# Patient Record
Sex: Female | Born: 1985 | Race: White | Hispanic: No | Marital: Single | Smoking: Never smoker
Health system: Southern US, Community
[De-identification: ages and names within clinical notes are randomized; demographics above are authoritative.]

## PROBLEM LIST (undated history)

## (undated) DIAGNOSIS — R87619 Unspecified abnormal cytological findings in specimens from cervix uteri: Secondary | ICD-10-CM

## (undated) DIAGNOSIS — E119 Type 2 diabetes mellitus without complications: Secondary | ICD-10-CM

## (undated) DIAGNOSIS — F329 Major depressive disorder, single episode, unspecified: Secondary | ICD-10-CM

## (undated) DIAGNOSIS — R569 Unspecified convulsions: Secondary | ICD-10-CM

## (undated) DIAGNOSIS — F32A Depression, unspecified: Secondary | ICD-10-CM

## (undated) HISTORY — DX: Major depressive disorder, single episode, unspecified: F32.9

## (undated) HISTORY — PX: NO PAST SURGERIES: SHX2092

## (undated) HISTORY — DX: Unspecified abnormal cytological findings in specimens from cervix uteri: R87.619

## (undated) HISTORY — DX: Unspecified convulsions: R56.9

## (undated) HISTORY — DX: Depression, unspecified: F32.A

---
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
## 2005-07-01 ENCOUNTER — Other Ambulatory Visit: Admission: RE | Admit: 2005-07-01 | Discharge: 2005-07-01 | Payer: Self-pay | Admitting: Obstetrics and Gynecology

## 2006-06-22 ENCOUNTER — Other Ambulatory Visit: Admission: RE | Admit: 2006-06-22 | Discharge: 2006-06-22 | Payer: Self-pay | Admitting: Obstetrics & Gynecology

## 2007-07-06 ENCOUNTER — Other Ambulatory Visit: Admission: RE | Admit: 2007-07-06 | Discharge: 2007-07-06 | Payer: Self-pay | Admitting: Obstetrics and Gynecology

## 2008-07-08 ENCOUNTER — Other Ambulatory Visit: Admission: RE | Admit: 2008-07-08 | Discharge: 2008-07-08 | Payer: Self-pay | Admitting: Obstetrics and Gynecology

## 2008-07-18 DIAGNOSIS — R569 Unspecified convulsions: Secondary | ICD-10-CM

## 2008-07-18 HISTORY — DX: Unspecified convulsions: R56.9

## 2010-06-24 ENCOUNTER — Emergency Department (HOSPITAL_COMMUNITY): Admission: EM | Admit: 2010-06-24 | Discharge: 2010-02-06 | Payer: Self-pay | Admitting: Emergency Medicine

## 2011-03-27 ENCOUNTER — Emergency Department (HOSPITAL_COMMUNITY)
Admission: EM | Admit: 2011-03-27 | Discharge: 2011-03-27 | Discharge status: Against Medical Advice | Payer: Self-pay | Attending: Emergency Medicine | Admitting: Emergency Medicine

## 2011-10-17 ENCOUNTER — Ambulatory Visit (INDEPENDENT_AMBULATORY_CARE_PROVIDER_SITE_OTHER): Payer: BC Managed Care – PPO | Admitting: Family Medicine

## 2011-10-17 ENCOUNTER — Ambulatory Visit: Payer: BC Managed Care – PPO

## 2011-10-17 VITALS — BP 116/80 | HR 106 | Temp 98.6°F | Resp 16 | Ht 61.25 in | Wt 126.6 lb

## 2011-10-17 DIAGNOSIS — M79673 Pain in unspecified foot: Secondary | ICD-10-CM

## 2011-10-17 DIAGNOSIS — M79609 Pain in unspecified limb: Secondary | ICD-10-CM

## 2011-10-17 MED ORDER — HYDROCODONE-ACETAMINOPHEN 5-500 MG PO TABS: 1.0000 | ORAL_TABLET | Freq: Three times a day (TID) | ORAL | Status: AC | PRN

## 2011-10-17 NOTE — Progress Notes (Signed)
  Patient Name: Kelly Gillespie Date of Birth: [birth year] Medical Record Number: [id number] Gender: female Date of Encounter: 10/17/2011  History of Present Illness:  Kelly Gillespie is a 26 y.o. very pleasant female patient who presents with the following:  Larey Seat off a high bar stool last night- caught her left foot in a slat of the chair and twisted it when she fell.  Has used ice and elevation but it is still very sore.  She is able to walk but it hurts.   There is no problem list on file for this patient.  No past medical history on file. No past surgical history on file. History  Substance Use Topics  . Smoking status: Never Smoker   . Smokeless tobacco: Not on file  . Alcohol Use: Not on file   No family history on file. No Known Allergies  Medication list has been reviewed and updated.  Review of Systems: As per HPI- otherwise negative. Denies any chance of pregnancy- on OCP  Physical Examination: Filed Vitals:   10/17/11 1540  BP: 116/80  Pulse: 106  Temp: 98.6 F (37 C)  TempSrc: Oral  Resp: 16  Height: 5' 1.25" (1.556 m)  Weight: 126 lb 9.6 oz (57.425 kg)    Body mass index is 23.73 kg/(m^2).  GEN: WDWN, NAD, Non-toxic, Alert & Oriented x 3 HEENT: Atraumatic, Normocephalic.  Normal respiratory effort.   Ears and Nose: No external deformity. EXTR: No clubbing/cyanosis/edema NEURO: antalgic gait.  PSYCH: Normally interactive. Conversant. Not depressed or anxious appearing.  Calm demeanor.  Left foot is bruised and swollen especially laterally. Tender across dorsum of foot.  Achilles intact.  Tib/ fib and ankle seem to be ok. DP pulse palpable.  Bruising and swelling are very suggestive of fracture.  Normal motion and sensation of toes.   [facility] reading (PRIMARY) by  Dr. Patsy Lager.  Suspect fifth proximal metatarsal fracture  LEFT FOOT - COMPLETE 3+ VIEW  Comparison: None.  Findings: There is no definite fracture. One could question a tiny linear lucency  at the base of the fifth metatarsal. I am not certain that this is an actual injury but to that possibility does exist.  IMPRESSION: No definite fracture. Question tiny linear lucency at the base of the fifth metatarsal that could represent a minimal nondisplaced fracture.  Assessment and Plan: 1. Pain in foot  DG Foot Complete Left, DG Ankle Complete Left, HYDROcodone-acetaminophen (VICODIN) 5-500 MG per tablet   Foot pain and swelling/ bruising which is suspicious for fracture- questionable fracture on xray.  Treat as fractured and protect with short CAM, crutches.  Continue to ice and elevate.  Called patient with xray overread results- plan repeat film in one week, sooner if worse.  May use vicodin as above as needed for more severe pain.

## 2011-10-17 NOTE — Progress Notes (Signed)
Addended by: Abbe Amsterdam C on: 10/17/2011 05:27 PM   Modules accepted: Level of Service

## 2011-10-26 ENCOUNTER — Ambulatory Visit (INDEPENDENT_AMBULATORY_CARE_PROVIDER_SITE_OTHER): Payer: BC Managed Care – PPO | Admitting: Internal Medicine

## 2011-10-26 ENCOUNTER — Ambulatory Visit: Payer: BC Managed Care – PPO

## 2011-10-26 VITALS — BP 118/75 | HR 94 | Resp 16 | Ht 60.0 in | Wt 126.0 lb

## 2011-10-26 DIAGNOSIS — M79609 Pain in unspecified limb: Secondary | ICD-10-CM

## 2011-10-26 DIAGNOSIS — S99922A Unspecified injury of left foot, initial encounter: Secondary | ICD-10-CM | POA: Insufficient documentation

## 2011-10-26 DIAGNOSIS — S92309A Fracture of unspecified metatarsal bone(s), unspecified foot, initial encounter for closed fracture: Secondary | ICD-10-CM

## 2011-10-26 NOTE — Progress Notes (Signed)
  Subjective:    Patient ID: Kelly Gillespie, female    DOB: [birth year], 26 y.o.   MRN: [id number]  HPI Here for recheck Fall 1 week ago  Dx with possible fx of metatarsal 5  Pain left foot continues 5/10 Swelling is diminished Wearing cam walker short boot but non weight bearing on crutches Taking Vicodin sparingly and ibuprofen otc    Review of Systems  Constitutional: Negative.   HENT: Negative.   Eyes: Negative.   Respiratory: Negative.   Cardiovascular: Negative.   Gastrointestinal: Negative.   Genitourinary: Negative.   Musculoskeletal: Positive for joint swelling and gait problem.  Skin: Negative.   Neurological: Negative.   Hematological: Negative.   Psychiatric/Behavioral: Negative.   All other systems reviewed and are negative.       Objective:   Physical Exam  Nursing note and vitals reviewed. Constitutional: She appears well-developed and well-nourished.  HENT:  Head: Normocephalic and atraumatic.  Eyes: Conjunctivae and EOM are normal.  Neck: Normal range of motion. Neck supple.  Cardiovascular: Normal rate, regular rhythm and normal heart sounds.   Pulmonary/Chest: Effort normal and breath sounds normal.  Abdominal: Soft.  Musculoskeletal: She exhibits tenderness.       Tender swelling pain over 5th metatarsal o f hte right foot  Skin: Skin is warm and dry.  Psychiatric: She has a normal mood and affect. Her behavior is normal. Judgment and thought content normal.     [facility] reading (PRIMARY) by  Dr. Mindi Junker small avulsion fx of 5th mt.     Assessment & Plan:  Xray right foot as above. Will allow pt to bear weight in am walker for 2 weeks then re eval

## 2011-10-26 NOTE — Patient Instructions (Signed)
You may bear weight wearing the cam walker for the next 2 weeks then return for re eval.

## 2012-05-04 ENCOUNTER — Ambulatory Visit (INDEPENDENT_AMBULATORY_CARE_PROVIDER_SITE_OTHER): Payer: BC Managed Care – PPO | Admitting: Physician Assistant

## 2012-05-04 VITALS — BP 102/64 | HR 115 | Temp 98.5°F | Resp 16 | Ht 61.25 in | Wt 120.0 lb

## 2012-05-04 DIAGNOSIS — R300 Dysuria: Secondary | ICD-10-CM

## 2012-05-04 DIAGNOSIS — N390 Urinary tract infection, site not specified: Secondary | ICD-10-CM

## 2012-05-04 LAB — POCT UA - MICROSCOPIC ONLY
Casts, Ur, LPF, POC: NEGATIVE
Crystals, Ur, HPF, POC: NEGATIVE
Mucus, UA: NEGATIVE
Yeast, UA: NEGATIVE

## 2012-05-04 LAB — POCT URINALYSIS DIPSTICK
Bilirubin, UA: NEGATIVE
Glucose, UA: NEGATIVE
Ketones, UA: NEGATIVE
Nitrite, UA: NEGATIVE
Protein, UA: 100
Spec Grav, UA: 1.025
Urobilinogen, UA: 2
pH, UA: 6.5

## 2012-05-04 MED ORDER — NITROFURANTOIN MONOHYD MACRO 100 MG PO CAPS: 100.0000 mg | ORAL_CAPSULE | Freq: Two times a day (BID) | ORAL | Status: DC

## 2012-05-04 NOTE — Patient Instructions (Signed)
Urinary Tract Infection Urinary tract infections (UTIs) can develop anywhere along your urinary tract. Your urinary tract is your body's drainage system for removing wastes and extra water. Your urinary tract includes two kidneys, two ureters, a bladder, and a urethra. Your kidneys are a pair of bean-shaped organs. Each kidney is about the size of your fist. They are located below your ribs, one on each side of your spine. CAUSES Infections are caused by microbes, which are microscopic organisms, including fungi, viruses, and bacteria. These organisms are so small that they can only be seen through a microscope. Bacteria are the microbes that most commonly cause UTIs. SYMPTOMS  Symptoms of UTIs may vary by age and gender of the patient and by the location of the infection. Symptoms in young women typically include a frequent and intense urge to urinate and a painful, burning feeling in the bladder or urethra during urination. Older women and men are more likely to be tired, shaky, and weak and have muscle aches and abdominal pain. A fever may mean the infection is in your kidneys. Other symptoms of a kidney infection include pain in your back or sides below the ribs, nausea, and vomiting. DIAGNOSIS To diagnose a UTI, your caregiver will ask you about your symptoms. Your caregiver also will ask to provide a urine sample. The urine sample will be tested for bacteria and white blood cells. White blood cells are made by your body to help fight infection. TREATMENT  Typically, UTIs can be treated with medication. Because most UTIs are caused by a bacterial infection, they usually can be treated with the use of antibiotics. The choice of antibiotic and length of treatment depend on your symptoms and the type of bacteria causing your infection. HOME CARE INSTRUCTIONS  If you were prescribed antibiotics, take them exactly as your caregiver instructs you. Finish the medication even if you feel better after you  have only taken some of the medication.  Drink enough water and fluids to keep your urine clear or pale yellow.  Avoid caffeine, tea, and carbonated beverages. They tend to irritate your bladder.  Empty your bladder often. Avoid holding urine for long periods of time.  Empty your bladder before and after sexual intercourse.  After a bowel movement, women should cleanse from front to back. Use each tissue only once. SEEK MEDICAL CARE IF:   You have back pain.  You develop a fever.  Your symptoms do not begin to resolve within 3 days. SEEK IMMEDIATE MEDICAL CARE IF:   You have severe back pain or lower abdominal pain.  You develop chills.  You have nausea or vomiting.  You have continued burning or discomfort with urination. MAKE SURE YOU:   Understand these instructions.  Will watch your condition.  Will get help right away if you are not doing well or get worse. Document Released: 04/13/2005 Document Revised: 01/03/2012 Document Reviewed: 08/12/2011 Exit[employer] Patient Information 2013 [employer].  

## 2012-05-04 NOTE — Progress Notes (Signed)
Reviewed and agree.

## 2012-05-04 NOTE — Progress Notes (Signed)
Subjective:    Patient ID: Kelly Gillespie, female    DOB: December 30, 1985, 26 y.o.   MRN: [id number]  HPI  [name] is a 26 yr old female with 5 day history of frequency, urgency, and dysuria.  She noticed some hematuria a couple days ago.  Denies fever, chills, NVD, back pain.  No vaginal discharge, no concern for STIs.  Had a UTI a couple months ago but does not remember what she was treated with.      Review of Systems  Constitutional: Negative for fever and chills.  Respiratory: Negative.   Cardiovascular: Negative.   Gastrointestinal: Negative for nausea, vomiting and diarrhea.  Genitourinary: Positive for dysuria, urgency, frequency and hematuria. Negative for flank pain, vaginal discharge and pelvic pain.  Musculoskeletal: Negative.   Neurological: Negative.        Objective:   Physical Exam  Vitals reviewed. Constitutional: She is oriented to person, place, and time. She appears well-developed and well-nourished. No distress.  Cardiovascular: Normal rate, regular rhythm and normal heart sounds.   Pulmonary/Chest: Breath sounds normal. She has no wheezes. She has no rales.  Abdominal: Soft. Bowel sounds are normal. There is no tenderness. There is no rebound, no guarding and no CVA tenderness.  Neurological: She is alert and oriented to person, place, and time.  Skin: Skin is warm and dry.    Filed Vitals:   05/04/12 0840  BP: 102/64  Pulse: 115  Temp: 98.5 F (36.9 C)  Resp: 16   Results for orders placed in visit on 05/04/12  POCT UA - MICROSCOPIC ONLY      Component Value Range   WBC, Ur, HPF, POC TNTC     RBC, urine, microscopic TNTC     Bacteria, U Microscopic 1+     Mucus, UA neg     Epithelial cells, urine per micros 0-1     Crystals, Ur, HPF, POC neg     Casts, Ur, LPF, POC neg     Yeast, UA neg    POCT URINALYSIS DIPSTICK      Component Value Range   Color, UA yellow     Clarity, UA cloudy     Glucose, UA neg     Bilirubin, UA neg     Ketones, UA  neg     Spec Grav, UA 1.025     Blood, UA large     pH, UA 6.5     Protein, UA 100     Urobilinogen, UA 2.0     Nitrite, UA neg     Leukocytes, UA large (3+)      Current Outpatient Prescriptions on File Prior to Visit  Medication Sig Dispense Refill  . amphetamine-dextroamphetamine (ADDERALL XR) 20 MG 24 hr capsule Take 20 mg by mouth every morning.      Marland Kitchen buPROPion (WELLBUTRIN SR) 150 MG 12 hr tablet Take 150 mg by mouth daily.      . Norethindrone Acet-Ethinyl Est (LOESTRIN 1/20, 21, PO) Take by mouth.      . topiramate (TOPAMAX) 100 MG tablet Take 100 mg by mouth daily.            Assessment & Plan:   1. Urinary tract infection  nitrofurantoin, macrocrystal-monohydrate, (MACROBID) 100 MG capsule, Urine culture  2. Dysuria  POCT UA - Microscopic Only, POCT urinalysis dipstick   [name] is a 27 yr old female with UTI.  Urine culture sent.  Will treat with nitrofurantoin x 10 days.  Will  adjust as necessary when culture data is available.  Pt will RTC if worsening or not improving.

## 2012-05-05 LAB — URINE CULTURE: Organism ID, Bacteria: NO GROWTH

## 2012-09-13 HISTORY — PX: COLPOSCOPY: SHX161

## 2013-08-06 ENCOUNTER — Encounter: Payer: Self-pay | Admitting: Nurse Practitioner

## 2013-08-06 ENCOUNTER — Ambulatory Visit (INDEPENDENT_AMBULATORY_CARE_PROVIDER_SITE_OTHER): Payer: 59 | Admitting: Nurse Practitioner

## 2013-08-06 VITALS — BP 118/84 | HR 72 | Ht 60.75 in | Wt 138.0 lb

## 2013-08-06 DIAGNOSIS — Z01419 Encounter for gynecological examination (general) (routine) without abnormal findings: Secondary | ICD-10-CM

## 2013-08-06 DIAGNOSIS — Z0000 Encounter for general adult medical examination without abnormal findings: Secondary | ICD-10-CM

## 2013-08-06 DIAGNOSIS — N390 Urinary tract infection, site not specified: Secondary | ICD-10-CM

## 2013-08-06 LAB — POCT URINALYSIS DIPSTICK
Bilirubin, UA: NEGATIVE
Glucose, UA: NEGATIVE
Ketones, UA: NEGATIVE
Nitrite, UA: POSITIVE
Urobilinogen, UA: NEGATIVE
pH, UA: 6

## 2013-08-06 MED ORDER — NITROFURANTOIN MONOHYD MACRO 100 MG PO CAPS: 100.0000 mg | ORAL_CAPSULE | Freq: Two times a day (BID) | ORAL | Status: DC

## 2013-08-06 MED ORDER — DESOGESTREL-ETHINYL ESTRADIOL 0.15-0.02/0.01 MG (21/5) PO TABS
1.0000 | ORAL_TABLET | Freq: Every day | ORAL | Status: DC
Start: 2013-08-06 — End: 2014-01-08

## 2013-08-06 MED ORDER — DESOGESTREL-ETHINYL ESTRADIOL 0.15-0.02/0.01 MG (21/5) PO TABS
1.0000 | ORAL_TABLET | Freq: Every day | ORAL | Status: DC
Start: 2013-08-06 — End: 2013-08-06

## 2013-08-06 NOTE — Patient Instructions (Signed)
General topics  Next pap or exam is  due in 1 year Take a Women's multivitamin Take 1200 mg. of calcium daily - prefer dietary If any concerns in interim to call back  Breast Self-Awareness Practicing breast self-awareness may pick up problems early, prevent significant medical complications, and possibly save your life. By practicing breast self-awareness, you can become familiar with how your breasts look and feel and if your breasts are changing. This allows you to notice changes early. It can also offer you some reassurance that your breast health is good. One way to learn what is normal for your breasts and whether your breasts are changing is to do a breast self-exam. If you find a lump or something that was not present in the past, it is best to contact your caregiver right away. Other findings that should be evaluated by your caregiver include nipple discharge, especially if it is bloody; skin changes or reddening; areas where the skin seems to be pulled in (retracted); or new lumps and bumps. Breast pain is seldom associated with cancer (malignancy), but should also be evaluated by a caregiver. BREAST SELF-EXAM The best time to examine your breasts is 5 7 days after your menstrual period is over.  ExitCare Patient Information 2013 [employer].   Exercise to Stay Healthy Exercise helps you become and stay healthy. EXERCISE IDEAS AND TIPS Choose exercises that:  You enjoy.  Fit into your day. You do not need to exercise really hard to be healthy. You can do exercises at a slow or medium level and stay healthy. You can:  Stretch before and after working out.  Try yoga, Pilates, or tai chi.  Lift weights.  Walk fast, swim, jog, run, climb stairs, bicycle, dance, or rollerskate.  Take aerobic classes. Exercises that burn about 150 calories:  Running 1  miles in 15 minutes.  Playing volleyball for 45 to 60 minutes.  Washing and waxing a car for 45 to 60  minutes.  Playing touch football for 45 minutes.  Walking 1  miles in 35 minutes.  Pushing a stroller 1  miles in 30 minutes.  Playing basketball for 30 minutes.  Raking leaves for 30 minutes.  Bicycling 5 miles in 30 minutes.  Walking 2 miles in 30 minutes.  Dancing for 30 minutes.  Shoveling snow for 15 minutes.  Swimming laps for 20 minutes.  Walking up stairs for 15 minutes.  Bicycling 4 miles in 15 minutes.  Gardening for 30 to 45 minutes.  Jumping rope for 15 minutes.  Washing windows or floors for 45 to 60 minutes. Document Released: 08/06/2010 Document Revised: 09/26/2011 Document Reviewed: 08/06/2010 Exit[employer] Patient Information 2013 [employer].   Other topics ( that may be useful information):    Sexually Transmitted Disease Sexually transmitted disease (STD) refers to any infection that is passed from person to person during sexual activity. This may happen by way of saliva, semen, blood, vaginal mucus, or urine. Common STDs include:  Gonorrhea.  Chlamydia.  Syphilis.  HIV/AIDS.  Genital herpes.  Hepatitis B and C.  Trichomonas.  Human papillomavirus (HPV).  Pubic lice. CAUSES  An STD may be spread by bacteria, virus, or parasite. A person can get an STD by:  Sexual intercourse with an infected person.  Sharing sex toys with an infected person.  Sharing needles with an infected person.  Having intimate contact with the genitals, mouth, or rectal areas of an infected person. SYMPTOMS  Some people may not have any symptoms, but   they can still pass the infection to others. Different STDs have different symptoms. Symptoms include:  Painful or bloody urination.  Pain in the pelvis, abdomen, vagina, anus, throat, or eyes.  Skin rash, itching, irritation, growths, or sores (lesions). These usually occur in the genital or anal area.  Abnormal vaginal discharge.  Penile discharge in men.  Soft, flesh-colored skin growths in the  genital or anal area.  Fever.  Pain or bleeding during sexual intercourse.  Swollen glands in the groin area.  Yellow skin and eyes (jaundice). This is seen with hepatitis. DIAGNOSIS  To make a diagnosis, your caregiver may:  Take a medical history.  Perform a physical exam.  Take a specimen (culture) to be examined.  Examine a sample of discharge under a microscope.  Perform blood test TREATMENT   Chlamydia, gonorrhea, trichomonas, and syphilis can be cured with antibiotic medicine.  Genital herpes, hepatitis, and HIV can be treated, but not cured, with prescribed medicines. The medicines will lessen the symptoms.  Genital warts from HPV can be treated with medicine or by freezing, burning (electrocautery), or surgery. Warts may come back.  HPV is a virus and cannot be cured with medicine or surgery.However, abnormal areas may be followed very closely by your caregiver and may be removed from the cervix, vagina, or vulva through office procedures or surgery. If your diagnosis is confirmed, your recent sexual partners need treatment. This is true even if they are symptom-free or have a negative culture or evaluation. They should not have sex until their caregiver says it is okay. HOME CARE INSTRUCTIONS  All sexual partners should be informed, tested, and treated for all STDs.  Take your antibiotics as directed. Finish them even if you start to feel better.  Only take over-the-counter or prescription medicines for pain, discomfort, or fever as directed by your caregiver.  Rest.  Eat a balanced diet and drink enough fluids to keep your urine clear or pale yellow.  Do not have sex until treatment is completed and you have followed up with your caregiver. STDs should be checked after treatment.  Keep all follow-up appointments, Pap tests, and blood tests as directed by your caregiver.  Only use latex condoms and water-soluble lubricants during sexual activity. Do not use  petroleum jelly or oils.  Avoid alcohol and illegal drugs.  Get vaccinated for HPV and hepatitis. If you have not received these vaccines in the past, talk to your caregiver about whether one or both might be right for you.  Avoid risky sex practices that can break the skin. The only way to avoid getting an STD is to avoid all sexual activity.Latex condoms and dental dams (for oral sex) will help lessen the risk of getting an STD, but will not completely eliminate the risk. SEEK MEDICAL CARE IF:   You have a fever.  You have any new or worsening symptoms. Document Released: 09/24/2002 Document Revised: 09/26/2011 Document Reviewed: 10/01/2010 [facility] Patient Information 2013 [employer].    Domestic Abuse You are being battered or abused if someone close to you hits, pushes, or physically hurts you in any way. You also are being abused if you are forced into activities. You are being sexually abused if you are forced to have sexual contact of any kind. You are being emotionally abused if you are made to feel worthless or if you are constantly threatened. It is important to remember that help is available. No one has the right to abuse you. PREVENTION OF FURTHER  ABUSE  Learn the warning signs of danger. This varies with situations but may include: the use of alcohol, threats, isolation from friends and family, or forced sexual contact. Leave if you feel that violence is going to occur.  If you are attacked or beaten, report it to the police so the abuse is documented. You do not have to press charges. The police can protect you while you or the attackers are leaving. Get the officer's name and badge number and a copy of the report.  Find someone you can trust and tell them what is happening to you: your caregiver, a nurse, clergy member, close friend or family member. Feeling ashamed is natural, but remember that you have done nothing wrong. No one deserves abuse. Document Released:  07/01/2000 Document Revised: 09/26/2011 Document Reviewed: 09/09/2010 Exit[employer] Patient Information 2013 [employer].    How Much is Too Much Alcohol? Drinking too much alcohol can cause injury, accidents, and health problems. These types of problems can include:   Car crashes.  Falls.  Family fighting (domestic violence).  Drowning.  Fights.  Injuries.  Burns.  Damage to certain organs.  Having a baby with birth defects. ONE DRINK CAN BE TOO MUCH WHEN YOU ARE:  Working.  Pregnant or breastfeeding.  Taking medicines. Ask your doctor.  Driving or planning to drive. If you or someone you know has a drinking problem, get help from a doctor.  Document Released: 04/30/2009 Document Revised: 09/26/2011 Document Reviewed: 04/30/2009 Exit[employer] Patient Information 2013 [employer].   Smoking Hazards Smoking cigarettes is extremely bad for your health. Tobacco smoke has over 200 known poisons in it. There are over 60 chemicals in tobacco smoke that cause cancer. Some of the chemicals found in cigarette smoke include:   Cyanide.  Benzene.  Formaldehyde.  Methanol (wood alcohol).  Acetylene (fuel used in welding torches).  Ammonia. Cigarette smoke also contains the poisonous gases nitrogen oxide and carbon monoxide.  Cigarette smokers have an increased risk of many serious medical problems and Smoking causes approximately:  90% of all lung cancer deaths in men.  80% of all lung cancer deaths in women.  90% of deaths from chronic obstructive lung disease. Compared with nonsmokers, smoking increases the risk of:  Coronary heart disease by 2 to 4 times.  Stroke by 2 to 4 times.  Men developing lung cancer by 23 times.  Women developing lung cancer by 13 times.  Dying from chronic obstructive lung diseases by 12 times.  . Smoking is the most preventable cause of death and disease in our society.  WHY IS SMOKING ADDICTIVE?  Nicotine is the chemical  agent in tobacco that is capable of causing addiction or dependence.  When you smoke and inhale, nicotine is absorbed rapidly into the bloodstream through your lungs. Nicotine absorbed through the lungs is capable of creating a powerful addiction. Both inhaled and non-inhaled nicotine may be addictive.  Addiction studies of cigarettes and spit tobacco show that addiction to nicotine occurs mainly during the teen years, when young people begin using tobacco products. WHAT ARE THE BENEFITS OF QUITTING?  There are many health benefits to quitting smoking.   Likelihood of developing cancer and heart disease decreases. Health improvements are seen almost immediately.  Blood pressure, pulse rate, and breathing patterns start returning to normal soon after quitting. QUITTING SMOKING   American Lung Association - 1-800-LUNGUSA  American Cancer Society - 1-800-ACS-2345 Document Released: 08/11/2004 Document Revised: 09/26/2011 Document Reviewed: 04/15/2009 ExitCare Patient Information 2013 [employer]   LL[employer].   Stress Management Stress is a state of physical or mental tension that often results from changes in your life or normal routine. Some common causes of stress are:  Death of a loved one.  Injuries or severe illnesses.  Getting fired or changing jobs.  Moving into a new home. Other causes may be:  Sexual problems.  Business or financial losses.  Taking on a large debt.  Regular conflict with someone at home or at work.  Constant tiredness from lack of sleep. It is not just bad things that are stressful. It may be stressful to:  Win the lottery.  Get married.  Buy a new car. The amount of stress that can be easily tolerated varies from person to person. Changes generally cause stress, regardless of the types of change. Too much stress can affect your health. It may lead to physical or emotional problems. Too little stress (boredom) may also become stressful. SUGGESTIONS TO  REDUCE STRESS:  Talk things over with your family and friends. It often is helpful to share your concerns and worries. If you feel your problem is serious, you may want to get help from a professional counselor.  Consider your problems one at a time instead of lumping them all together. Trying to take care of everything at once may seem impossible. List all the things you need to do and then start with the most important one. Set a goal to accomplish 2 or 3 things each day. If you expect to do too many in a single day you will naturally fail, causing you to feel even more stressed.  Do not use alcohol or drugs to relieve stress. Although you may feel better for a short time, they do not remove the problems that caused the stress. They can also be habit forming.  Exercise regularly - at least 3 times per week. Physical exercise can help to relieve that "uptight" feeling and will relax you.  The shortest distance between despair and hope is often a good night's sleep.  Go to bed and get up on time allowing yourself time for appointments without being rushed.  Take a short "time-out" period from any stressful situation that occurs during the day. Close your eyes and take some deep breaths. Starting with the muscles in your face, tense them, hold it for a few seconds, then relax. Repeat this with the muscles in your neck, shoulders, hand, stomach, back and legs.  Take good care of yourself. Eat a balanced diet and get plenty of rest.  Schedule time for having fun. Take a break from your daily routine to relax. HOME CARE INSTRUCTIONS   Call if you feel overwhelmed by your problems and feel you can no longer manage them on your own.  Return immediately if you feel like hurting yourself or someone else. Document Released: 12/28/2000 Document Revised: 09/26/2011 Document Reviewed: 08/20/2007 ExitCare Patient Information 2013 [employer].  

## 2013-08-06 NOTE — Progress Notes (Signed)
Patient ID: Kelly Gillespie, female   DOB: [birth year], 28 y.o.   MRN: [id number] 28 y.o. G0P0 Single Caucasian Fe here for annual exam.  Past few days urinary urgency and frequency. Since this new generic OCP of Microgestin has had weight gain, increase in PMS and bloating.  Still amenorrhea on OCP.  LMP was 07/21/12.  Same partner for over 2 years.  No concerns about STD's.  Patient's last menstrual period was 07/21/2012.          Sexually active: yes  The current method of family planning is OCP (estrogen/progesterone).    Exercising: no  The patient does not participate in regular exercise at present. Smoker:  no  Health Maintenance: Pap:  08/02/12, ASCUS, pos HR HPV, colposcopy 09/13/12 TDaP:  2005 Gardasil: completed in 2007 Labs: HB: declined Urine:  Pos nitrites, 1+ leuk's, trace blood, trace protein   reports that she has never smoked. She has never used smokeless tobacco. She reports that she drinks alcohol. She reports that she does not use illicit drugs.  Past Medical History  Diagnosis Date  . Seizures 07/2008  . Depression     Past Surgical History  Procedure Laterality Date  . No past surgeries      Current Outpatient Prescriptions  Medication Sig Dispense Refill  . amphetamine-dextroamphetamine (ADDERALL XR) 30 MG 24 hr capsule Take 30 mg by mouth 2 (two) times daily.      Marland Kitchen buPROPion (WELLBUTRIN SR) 150 MG 12 hr tablet Take 150 mg by mouth daily.      Marland Kitchen topiramate (TOPAMAX) 100 MG tablet Take 200 mg by mouth at bedtime.       Marland Kitchen desogestrel-ethinyl estradiol (MIRCETTE) 0.15-0.02/0.01 MG (21/5) tablet Take 1 tablet by mouth daily.  3 Package  3  . nitrofurantoin, macrocrystal-monohydrate, (MACROBID) 100 MG capsule Take 1 capsule (100 mg total) by mouth 2 (two) times daily.  14 capsule  0   No current facility-administered medications for this visit.    Family History  Problem Relation Age of Onset  . Diabetes Maternal Grandfather   . Diabetes Paternal Grandmother   .  Cancer Paternal Grandfather     prostate  . Hypertension Mother     ROS:  Pertinent items are noted in HPI.  Otherwise, a comprehensive ROS was negative.  Exam:   BP 118/84  Pulse 72  Ht 5' 0.75" (1.543 m)  Wt 138 lb (62.596 kg)  BMI 26.29 kg/m2  LMP 07/21/2012 Height: 5' 0.75" (154.3 cm)  Ht Readings from Last 3 Encounters:  08/06/13 5' 0.75" (1.543 m)  05/04/12 5' 1.25" (1.556 m)  10/26/11 5' (1.524 m)    General appearance: alert, cooperative and appears stated age Head: Normocephalic, without obvious abnormality, atraumatic Neck: no adenopathy, supple, symmetrical, trachea midline and thyroid normal to inspection and palpation Lungs: clear to auscultation bilaterally Breasts: normal appearance, no masses or tenderness Heart: regular rate and rhythm Abdomen: soft, non-tender; no masses,  no organomegaly Extremities: extremities normal, atraumatic, no cyanosis or edema Skin: Skin color, texture, turgor normal. No rashes or lesions Lymph nodes: Cervical, supraclavicular, and axillary nodes normal. No abnormal inguinal nodes palpated Neurologic: Grossly normal   Pelvic: External genitalia:  no lesions              Urethra:  normal appearing urethra with no masses, tenderness or lesions              Bartholin's and Skene's: normal  Vagina: normal appearing vagina with normal color and discharge, no lesions              Cervix: anteverted              Pap taken: yes Bimanual Exam:  Uterus:  normal size, contour, position, consistency, mobility, non-tender              Adnexa: no mass, fullness, tenderness               Rectovaginal: Confirms               Anus:  normal sphincter tone, no lesions  A:  Well Woman with normal exam  Contraception with OCP with amenorrhea since 07/2012  R/O UTI  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

## 2013-08-08 LAB — IPS PAP TEST WITH HPV

## 2013-08-08 LAB — URINE CULTURE: Colony Count: >=100000

## 2013-08-09 ENCOUNTER — Telehealth: Payer: Self-pay | Admitting: *Deleted

## 2013-08-09 NOTE — Progress Notes (Signed)
Encounter reviewed by Dr. Eloy Fehl Silva.  

## 2013-08-09 NOTE — Telephone Encounter (Signed)
I have attempted to contact this patient by phone with the following results: left message to return my call on answering machine (home/mobile).  

## 2013-08-09 NOTE — Telephone Encounter (Signed)
Message copied by Luisa DagoPHILLIPS, Shubham Thackston C on Fri Aug 09, 2013  2:24 PM ------      Message from: Ria CommentGRUBB, PATRICIA R      Created: Thu Aug 08, 2013  8:32 AM       Let patient know that urine culture is positive and she in on the correct antibiotic.  Will do TOC - nurse visit in 2  Weeks. ------

## 2013-08-14 NOTE — Telephone Encounter (Signed)
Pt previously notified in result note. 

## 2013-11-13 ENCOUNTER — Ambulatory Visit (INDEPENDENT_AMBULATORY_CARE_PROVIDER_SITE_OTHER): Payer: 59 | Admitting: Physician Assistant

## 2013-11-13 VITALS — BP 140/90 | HR 102 | Temp 98.2°F | Resp 16 | Ht 60.25 in | Wt 142.0 lb

## 2013-11-13 DIAGNOSIS — N390 Urinary tract infection, site not specified: Secondary | ICD-10-CM

## 2013-11-13 DIAGNOSIS — H6120 Impacted cerumen, unspecified ear: Secondary | ICD-10-CM

## 2013-11-13 DIAGNOSIS — R350 Frequency of micturition: Secondary | ICD-10-CM

## 2013-11-13 LAB — POCT URINALYSIS DIPSTICK
Bilirubin, UA: NEGATIVE
GLUCOSE UA: NEGATIVE
Ketones, UA: NEGATIVE
NITRITE UA: NEGATIVE
PH UA: 7.5
Protein, UA: NEGATIVE
SPEC GRAV UA: 1.015
UROBILINOGEN UA: 1

## 2013-11-13 LAB — POCT UA - MICROSCOPIC ONLY
CRYSTALS, UR, HPF, POC: NEGATIVE
Casts, Ur, LPF, POC: NEGATIVE
YEAST UA: NEGATIVE

## 2013-11-13 MED ORDER — NITROFURANTOIN MONOHYD MACRO 100 MG PO CAPS: 100.0000 mg | ORAL_CAPSULE | Freq: Two times a day (BID) | ORAL | Status: DC

## 2013-11-13 NOTE — Progress Notes (Signed)
Subjective:    Patient ID: Kelly Gillespie, female    DOB: 08/30/1985, 28 y.o.   MRN: [id number]  HPI   Kelly Gillespie is a very pleasant 28 yr old female here with two concerns today:  (1)  Can't hear out of LEFT ear.  Not painful but feels full - like when driving in the mountains.  Worse when she lays on that side.  Thought maybe allergies or sinus - allergy medicine did seem to help but relief was incomplete.  This has been going for a few days, maybe 1 wk.  The right ear is unaffected.  No known FB.  No drainage.  No fever  (2)  Urinary frequency and urgency x 1-2 weeks.  A little burning with urination.  Has had several UTIs recently - usually gets once about every 3-6 months.  Denies hematuria.  No abd pain, NV, FC.  No flank pain.  Has tried treating with cranberry juice, but no relief.  No vaginal symptoms.  OCPs for contraception - irregular periods due to this but thinks LMP 2 wks ago   Review of Systems  Constitutional: Negative for fever and chills.  HENT: Positive for ear pain (fullness).   Respiratory: Negative.   Cardiovascular: Negative.   Gastrointestinal: Negative.   Genitourinary: Positive for dysuria, urgency and frequency. Negative for hematuria, vaginal discharge and menstrual problem.  Musculoskeletal: Negative.   Skin: Negative.        Objective:   Physical Exam  Vitals reviewed. Constitutional: She is oriented to person, place, and time. She appears well-developed and well-nourished. No distress.  HENT:  Head: Normocephalic and atraumatic.  LEFT ear occluded with cerumen  Eyes: Conjunctivae are normal. No scleral icterus.  Cardiovascular: Normal rate, regular rhythm and normal heart sounds.   Pulmonary/Chest: Effort normal and breath sounds normal. She has no wheezes. She has no rales.  Abdominal: Soft. There is no tenderness.  Neurological: She is alert and oriented to person, place, and time.  Skin: Skin is warm and dry.  Psychiatric: She has a normal  mood and affect. Her behavior is normal.   Left ear successfully irrigated with resolution of symptoms.  On re-exam, canal is patent and TM is normal and intact  Results for orders placed in visit on 11/13/13  POCT UA - MICROSCOPIC ONLY      Result Value Ref Range   WBC, Ur, HPF, POC 3-6     RBC, urine, microscopic 1-3     Bacteria, U Microscopic 2+     Mucus, UA trace     Epithelial cells, urine per micros 2-4     Crystals, Ur, HPF, POC neg     Casts, Ur, LPF, POC neg     Yeast, UA neg     Amorphous, UA 2+    POCT URINALYSIS DIPSTICK      Result Value Ref Range   Color, UA yellow     Clarity, UA cloudy     Glucose, UA neg     Bilirubin, UA neg     Ketones, UA neg     Spec Grav, UA 1.015     Blood, UA trace     pH, UA 7.5     Protein, UA neg     Urobilinogen, UA 1.0     Nitrite, UA neg     Leukocytes, UA small (1+)         Assessment & Plan:  UTI (urinary tract infection) - Plan: Urine culture,  nitrofurantoin, macrocrystal-monohydrate, (MACROBID) 100 MG capsule  Frequency of urination - Plan: POCT UA - Microscopic Only, POCT urinalysis dipstick, Urine culture  Cerumen impaction   Kelly Gillespie is a very pleasant 28 yr old female here with two concerns: (1) LEFT cerumen impaction - ear irrigated successfully.  Symptoms improved.  Canal patent and TM intact  (2) UTI - frequent infections over the last several months.  Symptoms similar to prior infections.  Leuks and bacteria on UA.  Will treat empirically with macrobid.  Push fluids.  Cx sent  Pt to call or RTC if worsening or not improving  E. Frances FurbishElizabeth Marionna Gillespie MHS, PA-C [facility] 4/30/20151:52 PM

## 2013-11-13 NOTE — Patient Instructions (Signed)
The antibiotic prescribed today is for your present infection only. It is very important to follow the directions for the medication prescribed. Antibiotics are generally given for a specified period of time (7-10 days, for example) to be taken at specific intervals (every 4, 6, 8 or 12 hours). This is necessary to keep the right amount of the medication in the bloodstream. Too much of the medication may cause an adverse reaction, too little may not be completely effective.  To clear your infection completely, continue taking the antibiotic for the full time of treatment, even if you begin to feel better after a few days.  If you miss a dose of the antibiotic, take it as soon as possible. Then go back to your regular dosing schedule. However, don't double up doses.     Begin taking the macrobid as directed.  Finish the full course  Plenty of fluids (water is best!)  I will let you know when your culture results are back  Please let me know if any symptoms are worsening or not improving   Urinary Tract Infection Urinary tract infections (UTIs) can develop anywhere along your urinary tract. Your urinary tract is your body's drainage system for removing wastes and extra water. Your urinary tract includes two kidneys, two ureters, a bladder, and a urethra. Your kidneys are a pair of bean-shaped organs. Each kidney is about the size of your fist. They are located below your ribs, one on each side of your spine. CAUSES Infections are caused by microbes, which are microscopic organisms, including fungi, viruses, and bacteria. These organisms are so small that they can only be seen through a microscope. Bacteria are the microbes that most commonly cause UTIs. SYMPTOMS  Symptoms of UTIs may vary by age and gender of the patient and by the location of the infection. Symptoms in young women typically include a frequent and intense urge to urinate and a painful, burning feeling in the bladder or urethra during  urination. Older women and men are more likely to be tired, shaky, and weak and have muscle aches and abdominal pain. A fever may mean the infection is in your kidneys. Other symptoms of a kidney infection include pain in your back or sides below the ribs, nausea, and vomiting. DIAGNOSIS To diagnose a UTI, your caregiver will ask you about your symptoms. Your caregiver also will ask to provide a urine sample. The urine sample will be tested for bacteria and white blood cells. White blood cells are made by your body to help fight infection. TREATMENT  Typically, UTIs can be treated with medication. Because most UTIs are caused by a bacterial infection, they usually can be treated with the use of antibiotics. The choice of antibiotic and length of treatment depend on your symptoms and the type of bacteria causing your infection. HOME CARE INSTRUCTIONS  If you were prescribed antibiotics, take them exactly as your caregiver instructs you. Finish the medication even if you feel better after you have only taken some of the medication.  Drink enough water and fluids to keep your urine clear or pale yellow.  Avoid caffeine, tea, and carbonated beverages. They tend to irritate your bladder.  Empty your bladder often. Avoid holding urine for long periods of time.  Empty your bladder before and after sexual intercourse.  After a bowel movement, women should cleanse from front to back. Use each tissue only once. SEEK MEDICAL CARE IF:   You have back pain.  You develop a fever.  Your symptoms do not begin to resolve within 3 days. SEEK IMMEDIATE MEDICAL CARE IF:   You have severe back pain or lower abdominal pain.  You develop chills.  You have nausea or vomiting.  You have continued burning or discomfort with urination. MAKE SURE YOU:   Understand these instructions.  Will watch your condition.  Will get help right away if you are not doing well or get worse. Document Released:  04/13/2005 Document Revised: 01/03/2012 Document Reviewed: 08/12/2011 [facility] Patient Information 2014 [place], [place].

## 2013-11-16 LAB — URINE CULTURE: Colony Count: 60000

## 2014-01-06 ENCOUNTER — Telehealth: Payer: Self-pay | Admitting: Nurse Practitioner

## 2014-01-06 NOTE — Telephone Encounter (Signed)
Spoke with patient. Patient states that she was previously on Microgestin but was switched to Mircette in January due to the manufacture not making the microgestin any longer. Patient states since then she has been experiencing headaches, acne, mood swings, and has gained 30 pounds. Patient would like to switch to another form of OCP due to side effects. Advised patient would send a message over to Lauro FranklinPatricia Rolen-Grubb, FNP regarding change and give patient a call back with further recommendations and instructions. Patient is agreeable and verbalizes understanding.

## 2014-01-06 NOTE — Telephone Encounter (Signed)
Patient said she is having side effects from bc. Gained 30 lbs moodiness and ance breakouts

## 2014-01-07 NOTE — Telephone Encounter (Signed)
I agree that if she is not doing well to change meds.  Lets try Nordette for 3 months and see how she does.

## 2014-01-08 MED ORDER — LEVONORGESTREL-ETHINYL ESTRAD 0.15-30 MG-MCG PO TABS: 1.0000 | ORAL_TABLET | Freq: Every day | ORAL | Status: DC

## 2014-01-08 NOTE — Telephone Encounter (Signed)
Order placed for Nordette 0.15-30 MG-MCG for three month trial to see how patient does. Spoke with patient. Advised of this and that rx was sent to pharmacy of choice. Patient agreeable and verbalizes understanding.  Routing to Dr.Silva as covering CC: Kelly FranklinPatricia Rolen-Grubb, FNP   Routing to Brienna Bass for final review. Patient agreeable to disposition. Will close encounter

## 2014-04-17 ENCOUNTER — Telehealth: Payer: Self-pay | Admitting: Nurse Practitioner

## 2014-04-17 NOTE — Telephone Encounter (Signed)
Patient calling requesting to change her RX for birth control from RX below to something else.  levonorgestrel-ethinyl estradiol (NORDETTE) 0.15-30 MG-MCG tablet  Take 1 tablet by mouth daily., Starting 01/08/2014, Until Discontinued, Normal, Last Dose: Not Recorded  Refills: 3 ordered [id number] - [place], [place] - [address] [facility] [address]  Pharmacy on file is correct.

## 2014-04-17 NOTE — Telephone Encounter (Signed)
Spoke with patient. Patient states that she has been on "levora" for three-four months and has been having irritability and acne. Patient would like to switch OCP at this time. Patient was previously on microgestin which worked well for her until it was switched to generic. States with generic she was having weight gain, irritability, and acne. Patient would like to switch to another pill at this time. Advised patient would send a message over to covering provider and give her a call back with further recommendations. Patient is agreeable. Patient aware it may be tomorrow before return call due to covering provider seeing patients.

## 2014-04-17 NOTE — Telephone Encounter (Signed)
Patient needs OV to discuss

## 2014-04-18 NOTE — Telephone Encounter (Signed)
Spoke with patient. Advised of message as seen below from Verner Choleborah S. Leonard CNM. Patient is agreeable. Appointment scheduled for Monday 10/5 at 2:15pm with Lauro FranklinPatricia Rolen-Grubb, FNP. Patient agreeable to date and time.   Cc: Lauro FranklinPatricia Rolen-Grubb, FNP   Routing to provider for final review. Patient agreeable to disposition. Will close encounter

## 2014-04-21 ENCOUNTER — Ambulatory Visit (INDEPENDENT_AMBULATORY_CARE_PROVIDER_SITE_OTHER): Payer: 59 | Admitting: Nurse Practitioner

## 2014-04-21 ENCOUNTER — Encounter: Payer: Self-pay | Admitting: Nurse Practitioner

## 2014-04-21 VITALS — BP 124/84 | HR 72 | Ht 60.25 in | Wt 145.0 lb

## 2014-04-21 DIAGNOSIS — Z3009 Encounter for other general counseling and advice on contraception: Secondary | ICD-10-CM

## 2014-04-21 MED ORDER — NORETHIN ACE-ETH ESTRAD-FE 1-20 MG-MCG PO TABS: 1.0000 | ORAL_TABLET | Freq: Every day | ORAL | Status: DC

## 2014-04-21 NOTE — Progress Notes (Signed)
Patient ID: Kelly Gillespie, female   DOB: 11/24/1985, 28 y.o.   MRN: [id number] S: This 28 yo WS Fe Go comes in for a consult visit to discuss birth control.  She had been on Loestrin Fe 1/20 for years and did well.  There was a pharmacy back order issue and she was unable to get her generic brand of Microgestin.  At her AEX we then decided to change her to Mircette  (Azurette) and  had high co pay.  Also on Mircette she had headaches, acne, mood changes, and weight gain of 30 lbs..    She was then given  Nordette Johny Blamer( Levora) since mid June .  First month she had mid cycle bleeding. Then at off week no menses.  Since then she has had amenorrhea.  She has not missed cycles and has taken UPT at home and was negative.   With this pill having increase in acne, and PMS.  Some bloating and weight gain.  Now is exercising 3 times week trying to lose weight.  She does not want to consider Nexplanon, IUD, or Nuva Ring.  She would like to try going back to Loestrin if possible.  Plan:  Will go back on Loestrin Fe 1/20 and see how she does and AEX is due 1/ 2016.  If no better with symptoms to call back  Order is placed  consult time: 15 minutes

## 2014-04-21 NOTE — Patient Instructions (Signed)
None is needed

## 2014-04-22 ENCOUNTER — Encounter: Payer: Self-pay | Admitting: Nurse Practitioner

## 2014-04-24 NOTE — Progress Notes (Signed)
Encounter reviewed by Dr. Brook Silva.  

## 2014-08-07 ENCOUNTER — Ambulatory Visit (INDEPENDENT_AMBULATORY_CARE_PROVIDER_SITE_OTHER): Payer: 59 | Admitting: Nurse Practitioner

## 2014-08-07 ENCOUNTER — Encounter: Payer: Self-pay | Admitting: Nurse Practitioner

## 2014-08-07 VITALS — BP 126/92 | HR 80 | Ht 60.75 in | Wt 147.0 lb

## 2014-08-07 DIAGNOSIS — Z0000 Encounter for general adult medical examination without abnormal findings: Secondary | ICD-10-CM

## 2014-08-07 DIAGNOSIS — Z01419 Encounter for gynecological examination (general) (routine) without abnormal findings: Secondary | ICD-10-CM

## 2014-08-07 NOTE — Progress Notes (Signed)
Patient ID: Kelly Gillespie, female   DOB: 05/04/1986, 29 y.o.   MRN: [id number] 29 y.o. G0P0 Single  Caucasian Fe here for annual exam.  Some acne flares on Loestrin.  Menses now at 4-5 days, light to spotting.  No cramps. Same partner for 3 years.  Having bad headaches with a glass of wine. Now on Topamax 200 mg at hs X 3 weeks.  She has had no  BTB.  Patient's last menstrual period was 07/19/2014 (exact date).          Sexually active: Yes.    The current method of family planning is OCP (estrogen/progesterone).    Exercising: Yes.    walking approx 3 miles per week Smoker:  no  Health Maintenance: Pap:  08/06/13, negative with neg HR HPV; 08/02/12, ASCUS, pos HR HPV, colposcopy 09/13/12 TDaP:  2005 Labs:  HB:  Declined, has fainted in past Urine:  Negative    reports that she has never smoked. She has never used smokeless tobacco. She reports that she drinks alcohol. She reports that she does not use illicit drugs.  Past Medical History  Diagnosis Date  . Seizures 07/2008  . Depression     Past Surgical History  Procedure Laterality Date  . No past surgeries      Current Outpatient Prescriptions  Medication Sig Dispense Refill  . amphetamine-dextroamphetamine (ADDERALL XR) 30 MG 24 hr capsule Take 30 mg by mouth 2 (two) times daily.    Marland Kitchen. buPROPion (WELLBUTRIN XL) 150 MG 24 hr tablet Take 1 tablet by mouth daily.  12  . norethindrone-ethinyl estradiol (JUNEL FE,GILDESS FE,LOESTRIN FE) 1-20 MG-MCG tablet Take 1 tablet by mouth daily. 3 Package 2  . topiramate (TOPAMAX) 100 MG tablet Take 200 mg by mouth at bedtime.      No current facility-administered medications for this visit.    Family History  Problem Relation Age of Onset  . Diabetes Maternal Grandfather   . Diabetes Paternal Grandmother   . Cancer Paternal Grandfather     prostate  . Hypertension Mother     ROS:  Pertinent items are noted in HPI.  Otherwise, a comprehensive ROS was negative.  Exam:   BP 126/92 mmHg   Pulse 80  Ht 5' 0.75" (1.543 m)  Wt 147 lb (66.679 kg)  BMI 28.01 kg/m2  LMP 07/19/2014 (Exact Date) Height: 5' 0.75" (154.3 cm) Ht Readings from Last 3 Encounters:  08/07/14 5' 0.75" (1.543 m)  04/21/14 5' 0.25" (1.53 m)  11/13/13 5' 0.25" (1.53 m)    General appearance: alert, cooperative and appears stated age Head: Normocephalic, without obvious abnormality, atraumatic Neck: no adenopathy, supple, symmetrical, trachea midline and thyroid normal to inspection and palpation Lungs: clear to auscultation bilaterally Breasts: normal appearance, no masses or tenderness Heart: regular rate and rhythm Abdomen: soft, non-tender; no masses,  no organomegaly Extremities: extremities normal, atraumatic, no cyanosis or edema Skin: Skin color, texture, turgor normal. No rashes or lesions Lymph nodes: Cervical, supraclavicular, and axillary nodes normal. No abnormal inguinal nodes palpated Neurologic: Grossly normal   Pelvic: External genitalia:  no lesions              Urethra:  normal appearing urethra with no masses, tenderness or lesions              Bartholin's and Skene's: normal                 Vagina: normal appearing vagina with normal color and discharge, no lesions  Cervix: anteverted              Pap taken: Yes.   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

## 2014-08-07 NOTE — Patient Instructions (Signed)

## 2014-08-10 NOTE — Progress Notes (Signed)
Reviewed personally.  M. Suzanne Karell Tukes, MD.  

## 2014-08-13 LAB — IPS PAP TEST WITH HPV

## 2014-10-17 ENCOUNTER — Other Ambulatory Visit: Payer: Self-pay | Admitting: *Deleted

## 2014-10-17 MED ORDER — NORETHIN ACE-ETH ESTRAD-FE 1-20 MG-MCG PO TABS: 1.0000 | ORAL_TABLET | Freq: Every day | ORAL | Status: DC

## 2014-10-17 NOTE — Telephone Encounter (Signed)
Incoming fax from [facility] requesting OCP Medication refill request: Microgestin Fe 1-20. Patient is on Junel 1-20 Last AEX: 08/07/14 PG Next AEX: 08/11/15 PG Last MMG (if hormonal medication request): none Refill authorized: 04/21/14 #3packs/2R. Today #3packs/3Refills?

## 2015-01-05 ENCOUNTER — Ambulatory Visit (INDEPENDENT_AMBULATORY_CARE_PROVIDER_SITE_OTHER): Payer: BLUE CROSS/BLUE SHIELD | Admitting: Urgent Care

## 2015-01-05 VITALS — BP 108/78 | HR 117 | Temp 99.0°F | Resp 18 | Ht 61.0 in | Wt 148.0 lb

## 2015-01-05 DIAGNOSIS — L551 Sunburn of second degree: Secondary | ICD-10-CM

## 2015-01-05 MED ORDER — SILVER SULFADIAZINE 1 % EX CREA: 1.0000 "application " | TOPICAL_CREAM | Freq: Every day | CUTANEOUS | Status: AC

## 2015-01-05 NOTE — Progress Notes (Signed)
    MRN: [id number] DOB: 08-04-1985  Subjective:   Kelly Gillespie is a 29 y.o. female presenting for chief complaint of Sunburn  Reports going out to [place] in [place] over the weekend. She tried to apply sunblock but thinks she did not apply enough. She presents today for sunburn of her arms and legs. Reports redness, tenderness, hand swelling and blisters over her forearms. Patient admits that she has suffered sunburn "much worse" before. Denies any other aggravating or relieving factors, no other questions or concerns.  Chrystelle has a current medication list which includes the following prescription(s): amphetamine-dextroamphetamine, bupropion, norethindrone-ethinyl estradiol, and topiramate. She has No Known Allergies.  [place]  has a past medical history of Seizures (07/2008) and Depression. Also  has past surgical history that includes No past surgeries.  ROS As in subjective.  Objective:   Vitals: BP 108/78 mmHg  Pulse 117  Temp(Src) 99 F (37.2 C)  Resp 18  Ht 5\' 1"  (1.549 m)  Wt 148 lb (67.132 kg)  BMI 27.98 kg/m2  SpO2 99%  LMP 12/24/2014  Physical Exam  Constitutional: She is oriented to person, place, and time. She appears well-developed and well-nourished.  Cardiovascular: Normal rate.   88 on recheck by PA-Zeniya Lapidus.  Pulmonary/Chest: Effort normal.  Neurological: She is alert and oriented to person, place, and time.  Skin:      Assessment and Plan :   1. Sunburn of second degree 2. Sunburn, blistering - Stable, applied sulfadiazene to affected areas, dressed with Curlex. Rx sulfadiazine, counseled on skin care, she may use aloe vera with lidocaine (which she has from previous sunburn). RTC as needed.  Wallis Bamberg, PA-C [facility] [phone] 01/05/2015 5:14 PM

## 2015-01-05 NOTE — Patient Instructions (Signed)
Sunburn Sunburn is damage to the skin caused by overexposure to ultraviolet (UV) rays. People with light skin or a fair complexion may be more susceptible to sunburn. Repeated sun exposure causes early skin aging such as wrinkles and sun spots. It also increases the risk of skin cancer. CAUSES A sunburn is caused by getting too much UV radiation from the sun. SYMPTOMS  Red or pink skin.  Soreness and swelling.  Pain.  Blisters.  Peeling skin.  Headache, fever, and fatigue if sunburn covers a large area. TREATMENT  Your caregiver may tell you to take certain medicines to lessen inflammation.  Your caregiver may have you use hydrocortisone cream or spray to help with itching and inflammation.  Your caregiver may prescribe an antibiotic cream to use on blisters. HOME CARE INSTRUCTIONS   Avoid further exposure to the sun.  Cool baths and cool compresses may be helpful if used several times per day. Do not apply ice, since this may result in more damage to the skin.  Only take over-the-counter or prescription medicines for pain, discomfort, or fever as directed by your caregiver.  Use aloe or other over-the-counter sunburn creams or gels on your skin. Do not apply these creams or gels on blisters.  Drink enough fluids to keep your urine clear or pale yellow.  Do not break blisters. If blisters break, your caregiver may recommend an antibiotic cream to apply to the affected area. PREVENTION   Try to avoid the sun between 10:00 a.m. and 4:00 p.m. when it is the strongest.  Apply sunscreen at least 30 minutes before exposure to the sun.  Always wear protective hats, clothing, and sunglasses with UV protection.  Avoid medicines, herbs, and foods that increase your sensitivity to sunlight.  Avoid tanning beds. SEEK IMMEDIATE MEDICAL CARE IF:   You have a fever.  Your pain is uncontrolled with medicine.  You start to vomit or have diarrhea.  You feel faint or develop a  headache with confusion.  You develop severe blistering.  You have a pus-like (purulent) discharge coming from the blisters.  Your burn becomes more painful and swollen. MAKE SURE YOU:  Understand these instructions.  Will watch your condition.  Will get help right away if you are not doing well or get worse. Document Released: 04/13/2005 Document Revised: 10/29/2012 Document Reviewed: 12/26/2010 ExitCare Patient Information 2015 [employer]. This information is not intended to replace advice given to you by your health care provider. Make sure you discuss any questions you have with your health care provider.  

## 2015-04-20 ENCOUNTER — Ambulatory Visit (INDEPENDENT_AMBULATORY_CARE_PROVIDER_SITE_OTHER): Payer: BLUE CROSS/BLUE SHIELD | Admitting: Family Medicine

## 2015-04-20 VITALS — BP 126/84 | HR 106 | Temp 98.5°F | Resp 20 | Ht 61.0 in | Wt 147.0 lb

## 2015-04-20 DIAGNOSIS — J0100 Acute maxillary sinusitis, unspecified: Secondary | ICD-10-CM

## 2015-04-20 MED ORDER — HYDROCODONE-HOMATROPINE 5-1.5 MG/5ML PO SYRP: 5.0000 mL | ORAL_SOLUTION | Freq: Three times a day (TID) | ORAL | Status: DC | PRN

## 2015-04-20 MED ORDER — AMOXICILLIN 875 MG PO TABS: 875.0000 mg | ORAL_TABLET | Freq: Two times a day (BID) | ORAL | Status: DC

## 2015-04-20 NOTE — Patient Instructions (Signed)

## 2015-04-20 NOTE — Progress Notes (Signed)
This chart was scribed for Elvina Sidle, MD by Stann Ore, medical scribe at [facility].The patient was seen in exam room 3 and the patient's care was started at 6:17 PM.  Patient ID: Kelly Gillespie MRN: [id number], DOB: 10/03/1985, 29 y.o. Date of Encounter: 04/20/2015  Primary Physician: No PCP Per Patient  Chief Complaint:  Chief Complaint  Patient presents with  . Nasal Congestion    almost 1 month  . Cough    HPI:  Kelly Gillespie is a 29 y.o. female who presents to [facility] complaining of nasal congestion with coughs for almost a month. She also noticed having a sinus headache. When she yawns, she feels her ears pop. She's been taking OTC medication for mild relief. She hasn't noticed a fever. She denies history of asthma. She denies smoking. There has been sick contacts at work.   She was in [place], [place] for a business trip a week ago.  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   Past Medical History  Diagnosis Date  . Seizures (HCC) 07/2008  . Depression      Home Meds: Prior to Admission medications   Medication Sig Start Date End Date Taking? Authorizing Provider  amphetamine-dextroamphetamine (ADDERALL XR) 30 MG 24 hr capsule Take 30 mg by mouth 2 (two) times daily.   Yes Historical Provider, MD  buPROPion (WELLBUTRIN XL) 150 MG 24 hr tablet Take 1 tablet by mouth daily. 07/21/14  Yes Historical Provider, MD  levothyroxine (SYNTHROID, LEVOTHROID) 50 MCG tablet Take 50 mcg by mouth daily before breakfast.   Yes Historical Provider, MD  norethindrone-ethinyl estradiol (JUNEL FE,GILDESS FE,LOESTRIN FE) 1-20 MG-MCG tablet Take 1 tablet by mouth daily. 10/17/14  Yes Ria Comment, FNP  topiramate (TOPAMAX) 100 MG tablet Take 200 mg by mouth at bedtime.    Yes Historical Provider, MD    Allergies: No Known Allergies  Social History   Social History  . Marital Status: Single    Spouse Name: N/A  . Number of Children: 0  . Years of  Education: N/A   Occupational History  . WEB DESIGNER    Social History Main Topics  . Smoking status: Never Smoker   . Smokeless tobacco: Never Used  . Alcohol Use: 0.0 - 1.0 oz/week    0-2 drink(s) per week     Comment: socially, couple drinks per week  . Drug Use: No  . Sexual Activity:    Partners: Male    Birth Control/ Protection: Pill   Other Topics Concern  . Not on file   Social History Narrative     Review of Systems: Constitutional: negative for chills, fever, night sweats, weight changes, or fatigue  HEENT: negative for vision changes, hearing loss, rhinorrhea, ST, epistaxis; positive for congestion, sinus pressure Cardiovascular: negative for chest pain or palpitations Respiratory: negative for hemoptysis, wheezing, shortness of breath; positive for cough Abdominal: negative for abdominal pain, nausea, vomiting, diarrhea, or constipation Dermatological: negative for rash Neurologic: negative for dizziness, or syncope; positive for headache All other systems reviewed and are otherwise negative with the exception to those above and in the HPI.  Physical Exam: Blood pressure 126/84, pulse 106, temperature 98.5 F (36.9 C), resp. rate 20, height  (1.549 m), weight 147 lb (66.679 kg), last menstrual period 03/21/2015, SpO2 97 %., Body mass index is 27.79 kg/(m^2). General: Well developed, well nourished, in no acute distress. Head: Normocephalic, atraumatic, eyes without discharge, sclera non-icteric, Bilateral auditory canals  clear, TM's are without perforation, pearly grey and translucent with reflective cone of light bilaterally.  Neck: Supple. No thyromegaly. Full ROM. No lymphadenopathy. Lungs: Clear bilaterally to auscultation without wheezes, rales, or rhonchi. Breathing is unlabored. Heart: RRR with S1 S2. No murmurs, rubs, or gallops appreciated. Msk:  Strength and tone normal for age. Extremities/Skin: Warm and dry. No clubbing or cyanosis. No edema. No  rashes or suspicious lesions. Neuro: Alert and oriented X 3. Moves all extremities spontaneously. Gait is normal. CNII-XII grossly in tact. Psych:  Responds to questions appropriately with a normal affect.   Labs:  ASSESSMENT AND PLAN:  29 y.o. year old female with acute sinusitis  This chart was scribed in my presence and reviewed by me personally.    ICD-9-CM ICD-10-CM   1. Acute maxillary sinusitis, recurrence not specified 461.0 J01.00 amoxicillin (AMOXIL) 875 MG tablet     HYDROcodone-homatropine (HYCODAN) 5-1.5 MG/5ML syrup     Signed, Elvina Sidle, MD    By signing my name below, I, Stann Ore, attest that this documentation has been prepared under the direction and in the presence of Elvina Sidle, MD. Electronically Signed: Stann Ore, Scribe. 04/20/2015 , 6:17 PM .  Signed, Elvina Sidle, MD 04/20/2015 6:17 PM

## 2015-08-10 ENCOUNTER — Telehealth: Payer: Self-pay | Admitting: Nurse Practitioner

## 2015-08-10 NOTE — Telephone Encounter (Signed)
Left message to confirm appt for 08/11/15

## 2015-08-11 ENCOUNTER — Ambulatory Visit (INDEPENDENT_AMBULATORY_CARE_PROVIDER_SITE_OTHER): Payer: 59 | Admitting: Nurse Practitioner

## 2015-08-11 ENCOUNTER — Encounter: Payer: Self-pay | Admitting: Nurse Practitioner

## 2015-08-11 VITALS — BP 122/82 | HR 84 | Resp 16 | Ht 60.5 in | Wt 150.0 lb

## 2015-08-11 DIAGNOSIS — Z01419 Encounter for gynecological examination (general) (routine) without abnormal findings: Secondary | ICD-10-CM | POA: Diagnosis not present

## 2015-08-11 DIAGNOSIS — Z0000 Encounter for general adult medical examination without abnormal findings: Secondary | ICD-10-CM

## 2015-08-11 MED ORDER — NORETHIN ACE-ETH ESTRAD-FE 1-20 MG-MCG PO TABS: 1.0000 | ORAL_TABLET | Freq: Every day | ORAL | Status: DC

## 2015-08-11 NOTE — Patient Instructions (Signed)

## 2015-08-11 NOTE — Progress Notes (Signed)
Patient ID: Kelly Gillespie, female   DOB: July 11, 1986, 30 y.o.   MRN: [id number] 30 y.o. G0P0 Single  Caucasian Fe here for annual exam.  Usually no vaginal spotting or menses.  Continues to like OCP.  Same partner for 4 yrs. She is still having problems with HA's and Topamax not really helping all that much.    Patient's last menstrual period was 08/07/2015.  Light spotting only        Sexually active: Yes.    The current method of family planning is OCP (estrogen/progesterone).    Exercising: Yes.    walking/ exercise  Smoker:  no  Health Maintenance: Pap:  08-07-14 WNL NEG HR HPV Pap: 08/02/12, ASCUS, pos HR HPV, colposcopy 09/13/12 TDaP:  2005 HIV: will do today    reports that she has never smoked. She has never used smokeless tobacco. She reports that she drinks alcohol. She reports that she does not use illicit drugs.  Past Medical History  Diagnosis Date  . Seizures (HCC) 07/2008  . Depression     Past Surgical History  Procedure Laterality Date  . No past surgeries      Current Outpatient Prescriptions  Medication Sig Dispense Refill  . amphetamine-dextroamphetamine (ADDERALL XR) 30 MG 24 hr capsule Take 30 mg by mouth 2 (two) times daily.    Marland Kitchen buPROPion (WELLBUTRIN XL) 150 MG 24 hr tablet Take 1 tablet by mouth daily.  12  . levothyroxine (SYNTHROID, LEVOTHROID) 50 MCG tablet Take 50 mcg by mouth daily before breakfast.    . norethindrone-ethinyl estradiol (JUNEL FE,GILDESS FE,LOESTRIN FE) 1-20 MG-MCG tablet Take 1 tablet by mouth daily. 3 Package 4  . topiramate (TOPAMAX) 100 MG tablet Take 100 mg by mouth at bedtime. 3 tablets daily at HS     No current facility-administered medications for this visit.    Family History  Problem Relation Age of Onset  . Diabetes Maternal Grandfather   . Diabetes Paternal Grandmother   . Cancer Paternal Grandfather     prostate  . Hypertension Mother     ROS:  Pertinent items are noted in HPI.  Otherwise, a comprehensive ROS was  negative.  Exam:   BP 122/82 mmHg  Pulse 84  Resp 16  Ht 5' 0.5" (1.537 m)  Wt 150 lb (68.04 kg)  BMI 28.80 kg/m2  LMP 08/07/2015 Height: 5' 0.5" (153.7 cm) Ht Readings from Last 3 Encounters:  08/11/15 5' 0.5" (1.537 m)  04/20/15  (1.549 m)  01/05/15  (1.549 m)    General appearance: alert, cooperative and appears stated age Head: Normocephalic, without obvious abnormality, atraumatic Neck: no adenopathy, supple, symmetrical, trachea midline and thyroid normal to inspection and palpation Lungs: clear to auscultation bilaterally Breasts: normal appearance, no masses or tenderness Heart: regular rate and rhythm Abdomen: soft, non-tender; no masses,  no organomegaly Extremities: extremities normal, atraumatic, no cyanosis or edema Skin: Skin color, texture, turgor normal. No rashes or lesions Lymph nodes: Cervical, supraclavicular, and axillary nodes normal. No abnormal inguinal nodes palpated Neurologic: Grossly normal   Pelvic: External genitalia:  no lesions              Urethra:  normal appearing urethra with no masses, tenderness or lesions              Bartholin's and Skene's: normal                 Vagina: normal appearing vagina with normal color and discharge, no lesions  Cervix: anteverted              Pap taken: No. 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  She has done the best on this pill and wants to continue.  Counseled on breast self exam, STD prevention, HIV risk factors and prevention, use and side effects of OCP's, adequate intake of calcium and vitamin D, diet and exercise return annually or prn  An After Visit Summary was printed and given to the patient.

## 2015-08-12 LAB — HIV ANTIBODY (ROUTINE TESTING W REFLEX): HIV: NONREACTIVE

## 2015-08-14 NOTE — Progress Notes (Signed)
Encounter reviewed by Dr. Janean Sark.  Pap 08/06/13 - negative for SIL and negative HR HPV. Pap 08/07/14 - negative for SIL and negative HR HPV.

## 2016-01-15 ENCOUNTER — Ambulatory Visit (INDEPENDENT_AMBULATORY_CARE_PROVIDER_SITE_OTHER): Payer: 59 | Admitting: Family Medicine

## 2016-01-15 VITALS — BP 142/98 | HR 116 | Temp 99.7°F | Resp 18 | Ht 60.5 in | Wt 149.0 lb

## 2016-01-15 DIAGNOSIS — N3001 Acute cystitis with hematuria: Secondary | ICD-10-CM | POA: Diagnosis not present

## 2016-01-15 DIAGNOSIS — R102 Pelvic and perineal pain: Secondary | ICD-10-CM | POA: Diagnosis not present

## 2016-01-15 LAB — POCT URINALYSIS DIP (MANUAL ENTRY)
Bilirubin, UA: NEGATIVE
GLUCOSE UA: NEGATIVE
Leukocytes, UA: NEGATIVE
Nitrite, UA: POSITIVE — AB
SPEC GRAV UA: 1.02
Urobilinogen, UA: 1
pH, UA: 5

## 2016-01-15 LAB — POCT URINE PREGNANCY: Preg Test, Ur: NEGATIVE

## 2016-01-15 LAB — POC MICROSCOPIC URINALYSIS (UMFC): MUCUS RE: ABSENT

## 2016-01-15 MED ORDER — NITROFURANTOIN MONOHYD MACRO 100 MG PO CAPS: 100.0000 mg | ORAL_CAPSULE | Freq: Two times a day (BID) | ORAL | Status: DC

## 2016-01-15 NOTE — Patient Instructions (Signed)
     IF you received an x-ray today, you will receive an invoice from [facility]. Please contact [facility] at [phone] with questions or concerns regarding your invoice.   IF you received labwork today, you will receive an invoice from [facility]. Please contact [facility] at [phone] with questions or concerns regarding your invoice.   Our billing staff will not be able to assist you with questions regarding bills from these companies.  You will be contacted with the lab results as soon as they are available. The fastest way to get your results is to activate your My Chart account. Instructions are located on the last page of this paperwork. If you have not heard from us regarding the results in 2 weeks, please contact this office.      

## 2016-01-18 LAB — URINE CULTURE: Colony Count: >=100000

## 2016-01-18 NOTE — Progress Notes (Signed)
Subjective:    Patient ID: Kelly Gillespie, female    DOB: 11/25/1985, 30 y.o.   MRN: [id number] By signing my name below, I, Javier Dockerobert Ryan Halas, attest that this documentation has been prepared under the direction and in the presence of Norberto SorensonEva Zion Lint, MD. Electronically Signed: Javier Dockerobert Ryan Halas, ER Scribe. 01/15/2016. 8:09 AM.  Chief Complaint  Patient presents with  . Urinary Frequency    odor  . Pelvic Pain    Urinary Frequency  Associated symptoms include frequency and urgency. Pertinent negatives include no chills, hematuria, nausea or vomiting.  Pelvic Pain The patient's primary symptoms include pelvic pain. The patient's pertinent negatives include no vaginal discharge. Associated symptoms include frequency and urgency. Pertinent negatives include no abdominal pain, chills, constipation, diarrhea, dysuria, fever, hematuria, nausea, rash or vomiting.   HPI Comments: Kelly CordChelsea E Pettitt is a 30 y.o. female who [facility] complaining of increasing urinary frequency. No sig dysuria, she started taking azo for the past few days which did improve her symptoms some.  No f/c, no n/v, normal bowels. No vaginal discharge, periods normal.  On OCPs, no problems.  Sees gyn Berneta SagesPatti Grubbs. No antibiotics.  Past Medical History  Diagnosis Date  . Seizures (HCC) 07/2008  . Depression    No Known Allergies  Current Outpatient Prescriptions on File Prior to Visit  Medication Sig Dispense Refill  . amphetamine-dextroamphetamine (ADDERALL XR) 30 MG 24 hr capsule Take 30 mg by mouth 2 (two) times daily.    Marland Kitchen. buPROPion (WELLBUTRIN XL) 150 MG 24 hr tablet Take 1 tablet by mouth daily.  12  . levothyroxine (SYNTHROID, LEVOTHROID) 50 MCG tablet Take 50 mcg by mouth daily before breakfast.    . topiramate (TOPAMAX) 100 MG tablet Take 100 mg by mouth at bedtime. 2 tablets daily at HS     No current facility-administered medications on file prior to visit.    Review of Systems  Constitutional:  Negative for fever, chills, diaphoresis, activity change, appetite change, fatigue and unexpected weight change.  Gastrointestinal: Negative for nausea, vomiting, abdominal pain, diarrhea, constipation, blood in stool, anal bleeding and rectal pain.  Genitourinary: Positive for urgency, frequency and pelvic pain. Negative for dysuria, hematuria, decreased urine volume, vaginal bleeding, vaginal discharge, difficulty urinating, genital sores, vaginal pain and menstrual problem.  Musculoskeletal: Negative for gait problem.  Skin: Negative for rash.  Hematological: Negative for adenopathy.  Psychiatric/Behavioral: The patient is not nervous/anxious.        Objective:  BP 142/98 mmHg  Pulse 116  Temp(Src) 99.7 F (37.6 C) (Oral)  Resp 18  Ht 5' 0.5" (1.537 m)  Wt 149 lb (67.586 kg)  BMI 28.61 kg/m2  SpO2 97%  LMP 01/01/2016  Physical Exam  Constitutional: She is oriented to person, place, and time. She appears well-developed and well-nourished. No distress.  HENT:  Head: Normocephalic and atraumatic.  Eyes: Pupils are equal, round, and reactive to light.  Neck: Neck supple.  Cardiovascular: Normal rate.   Pulmonary/Chest: Effort normal. No respiratory distress.  Musculoskeletal: Normal range of motion.  Neurological: She is alert and oriented to person, place, and time. Coordination normal.  Skin: Skin is warm and dry. She is not diaphoretic.  Psychiatric: She has a normal mood and affect. Her behavior is normal.  Nursing note and vitals reviewed.     Results for orders placed or performed in visit on 01/15/16  Urine culture  Result Value Ref Range   Colony Count >=100,000 COLONIES/ML  Preliminary Report ESCHERICHIA COLI   POCT urinalysis dipstick  Result Value Ref Range   Color, UA orange (A) yellow   Clarity, UA clear clear   Glucose, UA negative negative   Bilirubin, UA negative negative   Ketones, POC UA trace (5) (A) negative   Spec Grav, UA 1.020    Blood, UA  trace-lysed (A) negative   pH, UA 5.0    Protein Ur, POC trace (A) negative   Urobilinogen, UA 1.0    Nitrite, UA Positive (A) Negative   Leukocytes, UA Negative Negative  POCT Microscopic Urinalysis ([facility])  Result Value Ref Range   WBC,UR,HPF,POC None None WBC/hpf   RBC,UR,HPF,POC Few (A) None RBC/hpf   Bacteria Many (A) None, Too numerous to count   Mucus Absent Absent   Epithelial Cells, UR Per Microscopy Moderate (A) None, Too numerous to count cells/hpf  POCT urine pregnancy  Result Value Ref Range   Preg Test, Ur Negative Negative    Assessment & Plan:   1. Pelvic pain in female   2. Acute cystitis with hematuria     Orders Placed This Encounter  Procedures  . Urine culture  . POCT urinalysis dipstick  . POCT Microscopic Urinalysis ([facility])  . POCT urine pregnancy    Meds ordered this encounter  Medications  . norethindrone-ethinyl estradiol-iron (MICROGESTIN FE,GILDESS FE,LOESTRIN FE) 1.5-30 MG-MCG tablet    Sig: Take 1 tablet by mouth daily.  . norethindrone-ethinyl estradiol (MICROGESTIN,JUNEL,LOESTRIN) 1-20 MG-MCG tablet    Sig: Take 1 tablet by mouth daily.  . nitrofurantoin, macrocrystal-monohydrate, (MACROBID) 100 MG capsule    Sig: Take 1 capsule (100 mg total) by mouth 2 (two) times daily.    Dispense:  14 capsule    Refill:  0    I personally performed the services described in this documentation, which was scribed in my presence. The recorded information has been reviewed and considered, and addended by me as needed.   Norberto SorensonEva Karyl Sharrar, M.D.  [facility]  [place] [address] [place] [address] [phone] phone [phone] fax  01/18/2016 8:09 AM

## 2016-04-25 ENCOUNTER — Ambulatory Visit (INDEPENDENT_AMBULATORY_CARE_PROVIDER_SITE_OTHER): Payer: 59 | Admitting: Urgent Care

## 2016-04-25 VITALS — BP 140/108 | HR 108 | Temp 98.4°F | Resp 16 | Ht 60.5 in | Wt 148.8 lb

## 2016-04-25 DIAGNOSIS — R0789 Other chest pain: Secondary | ICD-10-CM | POA: Diagnosis not present

## 2016-04-25 DIAGNOSIS — R05 Cough: Secondary | ICD-10-CM

## 2016-04-25 DIAGNOSIS — R059 Cough, unspecified: Secondary | ICD-10-CM

## 2016-04-25 DIAGNOSIS — R0602 Shortness of breath: Secondary | ICD-10-CM

## 2016-04-25 MED ORDER — ALBUTEROL SULFATE HFA 108 (90 BASE) MCG/ACT IN AERS: 2.0000 | INHALATION_SPRAY | Freq: Four times a day (QID) | RESPIRATORY_TRACT | 1 refills | Status: DC | PRN

## 2016-04-25 MED ORDER — HYDROCODONE-HOMATROPINE 5-1.5 MG/5ML PO SYRP: 5.0000 mL | ORAL_SOLUTION | Freq: Every evening | ORAL | 0 refills | Status: DC | PRN

## 2016-04-25 MED ORDER — AZITHROMYCIN 250 MG PO TABS: ORAL_TABLET | ORAL | 0 refills | Status: DC

## 2016-04-25 MED ORDER — BENZONATATE 100 MG PO CAPS: 100.0000 mg | ORAL_CAPSULE | Freq: Three times a day (TID) | ORAL | 0 refills | Status: DC | PRN

## 2016-04-25 NOTE — Patient Instructions (Addendum)
Cough, Adult Coughing is a reflex that clears your throat and your airways. Coughing helps to heal and protect your lungs. It is normal to cough occasionally, but a cough that happens with other symptoms or lasts a long time may be a sign of a condition that needs treatment. A cough may last only 2-3 weeks (acute), or it may last longer than 8 weeks (chronic). CAUSES Coughing is commonly caused by:  Breathing in substances that irritate your lungs.  A viral or bacterial respiratory infection.  Allergies.  Asthma.  Postnasal drip.  Smoking.  Acid backing up from the stomach into the esophagus (gastroesophageal reflux).  Certain medicines.  Chronic lung problems, including COPD (or rarely, lung cancer).  Other medical conditions such as heart failure. HOME CARE INSTRUCTIONS  Pay attention to any changes in your symptoms. Take these actions to help with your discomfort:  Take medicines only as told by your health care provider.  If you were prescribed an antibiotic medicine, take it as told by your health care provider. Do not stop taking the antibiotic even if you start to feel better.  Talk with your health care provider before you take a cough suppressant medicine.  Drink enough fluid to keep your urine clear or pale yellow.  If the air is dry, use a cold steam vaporizer or humidifier in your bedroom or your home to help loosen secretions.  Avoid anything that causes you to cough at work or at home.  If your cough is worse at night, try sleeping in a semi-upright position.  Avoid cigarette smoke. If you smoke, quit smoking. If you need help quitting, ask your health care provider.  Avoid caffeine.  Avoid alcohol.  Rest as needed. SEEK MEDICAL CARE IF:   You have new symptoms.  You cough up pus.  Your cough does not get better after 2-3 weeks, or your cough gets worse.  You cannot control your cough with suppressant medicines and you are losing sleep.  You  develop pain that is getting worse or pain that is not controlled with pain medicines.  You have a fever.  You have unexplained weight loss.  You have night sweats. SEEK IMMEDIATE MEDICAL CARE IF:  You cough up blood.  You have difficulty breathing.  Your heartbeat is very fast.   This information is not intended to replace advice given to you by your health care provider. Make sure you discuss any questions you have with your health care provider.   Document Released: 12/31/2010 Document Revised: 03/25/2015 Document Reviewed: 09/10/2014 Elsevier Interactive Patient Education 2016 [employer]     IF you received an x-ray today, you will receive an invoice from [facility]. Please contact [facility] at [phone] with questions or concerns regarding your invoice.   IF you received labwork today, you will receive an invoice from [facility]. Please contact [facility] at [phone] with questions or concerns regarding your invoice.   Our billing staff will not be able to assist you with questions regarding bills from these companies.  You will be contacted with the lab results as soon as they are available. The fastest way to get your results is to activate your My Chart account. Instructions are located on the last page of this paperwork. If you have not heard from us regarding the results in 2 weeks, please contact this office.      

## 2016-04-25 NOTE — Progress Notes (Signed)
Patient ID: Kelly Gillespie, female   DOB: 05/20/1986, 30 y.o.   MRN: [id number]   By signing my name below, I, Essence Howell, attest that this documentation has been prepared under the direction and in the presence of Wallis BambergMario Mani, PA-C Electronically Signed: Charline BillsEssence Howell, ED Scribe 04/25/2016 at 4:55 PM.  MRN: [id number] DOB: 03/09/1986  Subjective:   Kelly Gillespie is a 30 y.o. female presenting for chief complaint of Cough (sinus drainage x 3 weeks); Shortness of Breath (mostly in the morning); and Fatigue  Pt reports productive cough for the past 3 weeks that is worse at night. She states that symptoms started with rhinorrhea and nasal congestion which have resolved. However, she is now experiencing chest congestion, sob in the morning with walking up steps, fatigue and HA. She has tried Benadryl, an OTC sinus medication and Delsym without significant relief. Pt denies hemoptysis, sore throat, ear pain, nausea, vomiting, abdominal pain, rash. She denies h/o asthma. Pt is a nonsmoker.   Kelly Gillespie has a current medication list which includes the following prescription(s): amphetamine-dextroamphetamine, bupropion, levothyroxine, norethindrone-ethinyl estradiol-iron, spironolactone, and topiramate.   Also has No Known Allergies.  Kelly Gillespie  has a past medical history of Depression and Seizures (HCC) (07/2008). Also  has a past surgical history that includes No past surgeries and Colposcopy (09/13/12).  Objective:   Vitals: BP (!) 140/108 (BP Location: Right Arm, Patient Position: Sitting, Cuff Size: Normal)    Pulse (!) 108    Temp 98.4 F (36.9 C) (Oral)    Resp 16    Ht 5' 0.5" (1.537 m)    Wt 148 lb 12.8 oz (67.5 kg)    SpO2 100%    BMI 28.58 kg/m   Physical Exam  Constitutional: She is oriented to person, place, and time. She appears well-developed and well-nourished.  HENT:  TM's intact bilaterally, no effusions or erythema. Nasal turbinates pink and moist, nasal passages patent. No sinus  tenderness. Oropharynx clear, mucous membranes moist, dentition in good repair.  Eyes: Right eye exhibits no discharge. Left eye exhibits no discharge. No scleral icterus.  Cardiovascular: Normal rate, regular rhythm and intact distal pulses.  Exam reveals no gallop and no friction rub.   No murmur heard. Pulmonary/Chest: No respiratory distress. She has no wheezes. She has no rales.  Coarse lung sounds on the R mid to lower base  Lymphadenopathy:    She has cervical adenopathy (R-sided).  Neurological: She is alert and oriented to person, place, and time.   Assessment and Plan :   1. Cough 2. Atypical chest pain 3. Shortness of breath - Start azithromycin to cover for lower respiratory infection such as bronchitis. Use hycodan and tessalon for cough suppression, albuterol for shob. RTC in 1 week if no improvement, consider chest x-ray, lab work or short steroid course at that point. Patient agreed.  Wallis BambergMario Mani, PA-C [facility] [phone] 04/25/2016 4:55 PM

## 2016-05-06 ENCOUNTER — Other Ambulatory Visit: Payer: Self-pay

## 2016-05-06 NOTE — Telephone Encounter (Signed)
Patient was told by Urban GibsonMani that if she was still coughing in a week to call back and let him know she is wheezing and her chest is hurting, mostly a productive cough. She wants to know if he needs to see her again or if he wanted to just call her something in.  Her call back number is [phone]

## 2016-05-09 MED ORDER — PREDNISONE 20 MG PO TABS: ORAL_TABLET | ORAL | 0 refills | Status: DC

## 2016-05-09 NOTE — Telephone Encounter (Signed)
Patient is still having coughing spasms and would like to try the prednisone.(rx called in) Will come back to see us if no improvement or worsening symptoms.

## 2016-05-09 NOTE — Telephone Encounter (Signed)
Script sent to her pharmacy electronically. Please let patient know.

## 2016-05-09 NOTE — Telephone Encounter (Signed)
Called patient and requested call back to report her progress. Patient was last seen 1+ weeks ago. Used azithromycin, cough suppression medications and albuterol inhaler. She may need a short steroid course if her cough is persisting. If this is the case, please let me know and I will send a script for this. Let her know it would be 20mg  x2 each morning with breakfast for 5 days.  Thank you!

## 2016-05-10 NOTE — Telephone Encounter (Signed)
LMOM advising pt of Rx being sent and to RTC if worsens or does not improve on prednisone.

## 2016-07-29 ENCOUNTER — Other Ambulatory Visit: Payer: Self-pay | Admitting: Nurse Practitioner

## 2016-08-01 NOTE — Telephone Encounter (Signed)
Medication refill request: Microgestin FE Last AEX:  08-11-15  Next AEX: 08-12-16 Last MMG (if hormonal medication request): N/A Refill authorized: please advise

## 2016-08-12 ENCOUNTER — Ambulatory Visit (INDEPENDENT_AMBULATORY_CARE_PROVIDER_SITE_OTHER): Payer: 59 | Admitting: Nurse Practitioner

## 2016-08-12 ENCOUNTER — Encounter: Payer: Self-pay | Admitting: Nurse Practitioner

## 2016-08-12 VITALS — BP 120/74 | HR 64 | Ht 60.5 in | Wt 150.0 lb

## 2016-08-12 DIAGNOSIS — Z01419 Encounter for gynecological examination (general) (routine) without abnormal findings: Secondary | ICD-10-CM

## 2016-08-12 DIAGNOSIS — Z23 Encounter for immunization: Secondary | ICD-10-CM | POA: Diagnosis not present

## 2016-08-12 DIAGNOSIS — Z0000 Encounter for general adult medical examination without abnormal findings: Secondary | ICD-10-CM | POA: Diagnosis not present

## 2016-08-12 LAB — POCT URINALYSIS DIPSTICK
Glucose, UA: NEGATIVE
Nitrite, UA: NEGATIVE
PH UA: 6
UROBILINOGEN UA: NEGATIVE

## 2016-08-12 MED ORDER — NORETHIN ACE-ETH ESTRAD-FE 1-20 MG-MCG PO TABS: ORAL_TABLET | ORAL | 4 refills | Status: DC

## 2016-08-12 NOTE — Patient Instructions (Signed)

## 2016-08-12 NOTE — Progress Notes (Signed)
Patient ID: Kelly Gillespie, female   DOB: 10-29-1985, 31 y.o.   MRN: [id number]  31 y.o. G0P0000 Single  Caucasian Fe here for annual exam.  Menses is usually spotting if anything.  No other new problems.  She had UTI in 12/2015.  Does not feel like she has any of those symptoms today.  Patient's last menstrual period was 07/25/2016 (exact date).          Sexually active: Yes.    The current method of family planning is OCP (estrogen/progesterone) and condoms sometimes.    Exercising: Yes.    walking Smoker:  no  Health Maintenance: Pap: 08/07/14, Negative with neg HR HPV TDaP: 2005 HIV: 08/11/15 Labs: declined  Urine: Trace RBC, 1+ Bili, 2+ Ketones, Trace protein, 1+ WBC   reports that she has never smoked. She has never used smokeless tobacco. She reports that she drinks alcohol. She reports that she does not use drugs.  Past Medical History:  Diagnosis Date  . Depression   . Seizures (HCC) 07/2008    Past Surgical History:  Procedure Laterality Date  . 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 NO PAST SURGERIES      Current Outpatient Prescriptions  Medication Sig Dispense Refill  . amphetamine-dextroamphetamine (ADDERALL XR) 30 MG 24 hr capsule Take 30 mg by mouth 2 (two) times daily.    Marland Kitchen buPROPion (WELLBUTRIN XL) 150 MG 24 hr tablet Take 1 tablet by mouth daily.  12  . levothyroxine (SYNTHROID, LEVOTHROID) 50 MCG tablet Take 50 mcg by mouth daily before breakfast.    . MICROGESTIN FE 1/20 1-20 MG-MCG tablet Take 1 tablet by mouth daily. 3 Package 0  . spironolactone (ALDACTONE) 50 MG tablet Take 50 mg by mouth daily.    Marland Kitchen topiramate (TOPAMAX) 100 MG tablet Take 100 mg by mouth at bedtime. 2 tablets daily at HS     No current facility-administered medications for this visit.     Family History  Problem Relation Age of Onset  . Diabetes Maternal Grandfather   . Diabetes Paternal Grandmother   . Dementia Paternal Grandmother   . Cancer Paternal  Grandfather     prostate  . Hypertension Mother     ROS:  Pertinent items are noted in HPI.  Otherwise, a comprehensive ROS was negative.  Exam:   BP 120/74 (BP Location: Right Arm, Patient Position: Sitting, Cuff Size: Normal)   Pulse 64   Ht 5' 0.5" (1.537 m)   Wt 150 lb (68 kg)   LMP 07/25/2016 (Exact Date)   BMI 28.81 kg/m  Height: 5' 0.5" (153.7 cm) Ht Readings from Last 3 Encounters:  08/12/16 5' 0.5" (1.537 m)  04/25/16 5' 0.5" (1.537 m)  01/15/16 5' 0.5" (1.537 m)    General appearance: alert, cooperative and appears stated age Head: Normocephalic, without obvious abnormality, atraumatic Neck: no adenopathy, supple, symmetrical, trachea midline and thyroid normal to inspection and palpation Lungs: clear to auscultation bilaterally Breasts: normal appearance, no masses or tenderness Heart: regular rate and rhythm Abdomen: soft, non-tender; no masses,  no organomegaly Extremities: extremities normal, atraumatic, no cyanosis or edema Skin: Skin color, texture, turgor normal. No rashes or lesions Lymph nodes: Cervical, supraclavicular, and axillary nodes normal. No abnormal inguinal nodes palpated Neurologic: Grossly normal   Pelvic: External genitalia:  no lesions              Urethra:  normal appearing urethra with no masses, tenderness  or lesions              Bartholin's and Skene's: normal                 Vagina: normal appearing vagina with normal color and discharge, no lesions              Cervix: anteverted              Pap taken: No. 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

## 2016-08-13 LAB — URINALYSIS, MICROSCOPIC ONLY
CASTS: NONE SEEN [LPF]
CRYSTALS: NONE SEEN [HPF]
Yeast: NONE SEEN [HPF]

## 2016-08-14 LAB — URINE CULTURE

## 2016-08-14 NOTE — Progress Notes (Signed)
Encounter reviewed by Dr. Brook Amundson C. Silva.  

## 2017-02-23 ENCOUNTER — Telehealth: Payer: Self-pay | Admitting: Obstetrics and Gynecology

## 2017-02-23 NOTE — Telephone Encounter (Signed)
Left message regarding upcoming appointment has been canceled and needs to be rescheduled. °

## 2017-05-11 DIAGNOSIS — Z23 Encounter for immunization: Secondary | ICD-10-CM | POA: Diagnosis not present

## 2017-07-07 ENCOUNTER — Other Ambulatory Visit: Payer: Self-pay

## 2017-07-07 MED ORDER — NORETHIN ACE-ETH ESTRAD-FE 1-20 MG-MCG PO TABS: ORAL_TABLET | ORAL | 0 refills | Status: DC

## 2017-07-07 NOTE — Telephone Encounter (Signed)
Medication refill request: MICROGESTIN Last AEX:  08/12/16 PG Next AEX: 08/16/17 DL Last MMG (if hormonal medication request): n/a Refill authorized: 08/12/16 #4 packs w/4 refills; today please advise, patient is good with another month of pills, however pharmacy states that they like to send refill requests 1 month in advance.

## 2017-08-15 ENCOUNTER — Ambulatory Visit: Payer: 59 | Admitting: Nurse Practitioner

## 2017-08-16 ENCOUNTER — Ambulatory Visit (INDEPENDENT_AMBULATORY_CARE_PROVIDER_SITE_OTHER): Payer: BLUE CROSS/BLUE SHIELD | Admitting: Certified Nurse Midwife

## 2017-08-16 ENCOUNTER — Other Ambulatory Visit: Payer: Self-pay

## 2017-08-16 ENCOUNTER — Other Ambulatory Visit (HOSPITAL_COMMUNITY)
Admission: RE | Admit: 2017-08-16 | Discharge: 2017-08-16 | Disposition: A | Discharge status: Home / Self Care | Payer: BLUE CROSS/BLUE SHIELD | Source: Ambulatory Visit | Attending: Certified Nurse Midwife | Admitting: Certified Nurse Midwife

## 2017-08-16 ENCOUNTER — Encounter: Payer: Self-pay | Admitting: Certified Nurse Midwife

## 2017-08-16 VITALS — BP 122/72 | HR 70 | Resp 16 | Ht 60.75 in | Wt 174.0 lb

## 2017-08-16 DIAGNOSIS — Z01419 Encounter for gynecological examination (general) (routine) without abnormal findings: Secondary | ICD-10-CM | POA: Insufficient documentation

## 2017-08-16 DIAGNOSIS — Z3041 Encounter for surveillance of contraceptive pills: Secondary | ICD-10-CM | POA: Diagnosis not present

## 2017-08-16 DIAGNOSIS — F418 Other specified anxiety disorders: Secondary | ICD-10-CM | POA: Insufficient documentation

## 2017-08-16 DIAGNOSIS — Z124 Encounter for screening for malignant neoplasm of cervix: Secondary | ICD-10-CM | POA: Diagnosis not present

## 2017-08-16 MED ORDER — NORETHIN ACE-ETH ESTRAD-FE 1-20 MG-MCG PO TABS: ORAL_TABLET | ORAL | 4 refills | Status: DC

## 2017-08-16 NOTE — Progress Notes (Signed)
32 y.o. G0P0000 Single  Caucasian Fe here for annual exam. Periods are none with OCP use. Sees MD for Wellbutrin and Prozac for anxiety and depression. Both Stable.  Same partner for past 5 years and no STD concerns or testing. Has labs at work and all normal(will send copy here). Sees Urgent care if needed. No health issues today.  No LMP recorded. Patient is not currently having periods (Reason: Oral contraceptives).          Sexually active: Yes.    The current method of family planning is OCP (estrogen/progesterone).    Exercising: Yes.    walking, yoga Smoker:  no  Health Maintenance: Pap:  08/07/14 neg HPV HR neg History of Abnormal Pap: yes MMG:  none Self Breast exams: no Colonoscopy:  none BMD:   none TDaP:  2018 Shingles: no Pneumonia: no Hep C and HIV: HIV neg 2017 Labs: if needed Flu Vaccine yes   reports that  has never smoked. she has never used smokeless tobacco. She reports that she drinks about 0.6 oz of alcohol per week. She reports that she does not use drugs.  Past Medical History:  Diagnosis Date  . Depression   . Seizures (HCC) 07/2008    Past Surgical History:  Procedure Laterality Date  . 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 NO PAST SURGERIES      Current Outpatient Medications  Medication Sig Dispense Refill  . amphetamine-dextroamphetamine (ADDERALL XR) 30 MG 24 hr capsule Take 30 mg by mouth 2 (two) times daily.    Marland Kitchen buPROPion (WELLBUTRIN XL) 150 MG 24 hr tablet Take 1 tablet by mouth daily.  12  . FLUoxetine (PROZAC) 10 MG capsule TK 1 C PO QD  12  . norethindrone-ethinyl estradiol (MICROGESTIN FE 1/20) 1-20 MG-MCG tablet Take continuous active pills.  3 months = 4 packs 4 Package 0   No current facility-administered medications for this visit.     Family History  Problem Relation Age of Onset  . Diabetes Maternal Grandfather   . Diabetes Paternal Grandmother   . Dementia Paternal Grandmother   . Cancer Paternal  Grandfather        prostate  . Hypertension Mother     ROS:  Pertinent items are noted in HPI.  Otherwise, a comprehensive ROS was negative.  Exam:   BP 140/80   Pulse 70   Resp 16   Ht 5' 0.75" (1.543 m)   Wt 174 lb (78.9 kg)   BMI 33.15 kg/m  Height: 5' 0.75" (154.3 cm) Ht Readings from Last 3 Encounters:  08/16/17 5' 0.75" (1.543 m)  08/12/16 5' 0.5" (1.537 m)  04/25/16 5' 0.5" (1.537 m)    General appearance: alert, cooperative and appears stated age Head: Normocephalic, without obvious abnormality, atraumatic Neck: no adenopathy, supple, symmetrical, trachea midline and thyroid normal to inspection and palpation Lungs: clear to auscultation bilaterally Breasts: normal appearance, no masses or tenderness, No nipple retraction or dimpling, No nipple discharge or bleeding, No axillary or supraclavicular adenopathy Heart: regular rate and rhythm Abdomen: soft, non-tender; no masses,  no organomegaly Extremities: extremities normal, atraumatic, no cyanosis or edema Skin: Skin color, texture, turgor normal. No rashes or lesions Lymph nodes: Cervical, supraclavicular, and axillary nodes normal. No abnormal inguinal nodes palpated Neurologic: Grossly normal   Pelvic: External genitalia:  no lesions              Urethra:  normal appearing urethra  with no masses, tenderness or lesions              Bartholin's and Skene's: normal                 Vagina: normal appearing vagina with normal color and discharge, no lesions              Cervix: no cervical motion tenderness, no lesions and nulliparous appearance              Pap taken: Yes.   Bimanual Exam:  Uterus:  normal size, contour, position, consistency, mobility, non-tender              Adnexa: normal adnexa and no mass, fullness, tenderness               Rectovaginal: Confirms               Anus:  normal sphincter tone, no lesions  Chaperone present: yes  A:  Well Woman with normal exam  Contraception OCP  desired  Anxiety/depression with MD managment  P:   Reviewed health and wellness pertinent to exam  Discussed risks/benefits /warning signs of OCP, desires continuance. Discussed random checks of BP and if elevation needs to advise. (Due to first BP today)  Rx Microgestin 1/20 Fe see order with instructions  Continue follow up with MD as indicated.  Pap smear: yes   counseled on breast self exam, use and side effects of OCP's, adequate intake of calcium and vitamin D, diet and exercise  return annually or prn  An After Visit Summary was printed and given to the patient.

## 2017-08-16 NOTE — Patient Instructions (Signed)

## 2017-08-18 LAB — CYTOLOGY - PAP
Diagnosis: NEGATIVE
HPV: NOT DETECTED

## 2017-09-01 DIAGNOSIS — H5213 Myopia, bilateral: Secondary | ICD-10-CM | POA: Diagnosis not present

## 2017-10-17 DIAGNOSIS — J019 Acute sinusitis, unspecified: Secondary | ICD-10-CM | POA: Diagnosis not present

## 2018-03-06 ENCOUNTER — Other Ambulatory Visit: Payer: Self-pay

## 2018-03-06 ENCOUNTER — Ambulatory Visit (INDEPENDENT_AMBULATORY_CARE_PROVIDER_SITE_OTHER): Payer: BLUE CROSS/BLUE SHIELD | Admitting: Physician Assistant

## 2018-03-06 ENCOUNTER — Encounter: Payer: Self-pay | Admitting: Physician Assistant

## 2018-03-06 VITALS — BP 152/115 | HR 112 | Temp 100.0°F | Resp 20 | Ht 61.3 in | Wt 183.8 lb

## 2018-03-06 DIAGNOSIS — J029 Acute pharyngitis, unspecified: Secondary | ICD-10-CM

## 2018-03-06 DIAGNOSIS — R5383 Other fatigue: Secondary | ICD-10-CM

## 2018-03-06 DIAGNOSIS — R030 Elevated blood-pressure reading, without diagnosis of hypertension: Secondary | ICD-10-CM | POA: Diagnosis not present

## 2018-03-06 DIAGNOSIS — R059 Cough, unspecified: Secondary | ICD-10-CM

## 2018-03-06 DIAGNOSIS — R05 Cough: Secondary | ICD-10-CM

## 2018-03-06 LAB — POCT RAPID STREP A (OFFICE): Rapid Strep A Screen: NEGATIVE

## 2018-03-06 LAB — POCT CBC
Granulocyte percent: 77 %G (ref 37–80)
HCT, POC: 40.8 % (ref 37.7–47.9)
HEMOGLOBIN: 13.9 g/dL (ref 12.2–16.2)
LYMPH, POC: 2.2 (ref 0.6–3.4)
MCH, POC: 27 pg (ref 27–31.2)
MCHC: 34.1 g/dL (ref 31.8–35.4)
MCV: 79.2 fL — AB (ref 80–97)
MID (cbc): 0.4 (ref 0–0.9)
MPV: 7 fL (ref 0–99.8)
PLATELET COUNT, POC: 432 10*3/uL — AB (ref 142–424)
POC Granulocyte: 8.8 — AB (ref 2–6.9)
POC LYMPH PERCENT: 19.5 %L (ref 10–50)
POC MID %: 3.5 %M (ref 0–12)
RBC: 5.15 M/uL (ref 4.04–5.48)
RDW, POC: 13.4 %
WBC: 11.4 10*3/uL — AB (ref 4.6–10.2)

## 2018-03-06 LAB — POC INFLUENZA A&B (BINAX/QUICKVUE)
INFLUENZA B, POC: NEGATIVE
Influenza A, POC: NEGATIVE

## 2018-03-06 MED ORDER — BENZONATATE 100 MG PO CAPS: 100.0000 mg | ORAL_CAPSULE | Freq: Three times a day (TID) | ORAL | 0 refills | Status: DC | PRN

## 2018-03-06 MED ORDER — AZITHROMYCIN 250 MG PO TABS: ORAL_TABLET | ORAL | 0 refills | Status: DC

## 2018-03-06 NOTE — Patient Instructions (Addendum)
- We will treat for underlying bacterial infection with a zpack.  - I recommend you rest, drink plenty of fluids, eat light meals including soups.  - You may use cough syrup at night for your cough and sore throat, Tessalon pearls during the day.  - You may also use Tylenol or ibuprofen over-the-counter for your sore throat. Tea recipe for sore throat: boil water, add 2 inches shaved ginger root, steep 15 minutes, add juice from 2 full lemons, and 2 tbsp honey. -Please return in 2 days for reevaluation.  If any of your symptoms worsen or you develop new shortness of breath, chest pain, lower leg swelling, abdominal pain, nausea, vomiting please seek care immediately.   Sore Throat When you have a sore throat, your throat may:  Hurt.  Burn.  Feel irritated.  Feel scratchy.  Many things can cause a sore throat, including:  An infection.  Allergies.  Dryness in the air.  Smoke or pollution.  Gastroesophageal reflux disease (GERD).  A tumor.  A sore throat can be the first sign of another sickness. It can happen with other problems, like coughing or a fever. Most sore throats go away without treatment. Follow these instructions at home:  Take over-the-counter medicines only as told by your doctor.  Drink enough fluids to keep your pee (urine) clear or pale yellow.  Rest when you feel you need to.  To help with pain, try: ? Sipping warm liquids, such as broth, herbal tea, or warm water. ? Eating or drinking cold or frozen liquids, such as frozen ice pops. ? Gargling with a salt-water mixture 3-4 times a day or as needed. To make a salt-water mixture, add -1 tsp of salt in 1 cup of warm water. Mix it until you cannot see the salt anymore. ? Sucking on hard candy or throat lozenges. ? Putting a cool-mist humidifier in your bedroom at night. ? Sitting in the bathroom with the door closed for 5-10 minutes while you run hot water in the shower.  Do not use any tobacco  products, such as cigarettes, chewing tobacco, and e-cigarettes. If you need help quitting, ask your doctor. Contact a doctor if:  You have a fever for more than 2-3 days.  You keep having symptoms for more than 2-3 days.  Your throat does not get better in 7 days.  You have a fever and your symptoms suddenly get worse. Get help right away if:  You have trouble breathing.  You cannot swallow fluids, soft foods, or your saliva.  You have swelling in your throat or neck that gets worse.  You keep feeling like you are going to throw up (vomit).  You keep throwing up. This information is not intended to replace advice given to you by your health care provider. Make sure you discuss any questions you have with your health care provider. Document Released: 04/12/2008 Document Revised: 02/28/2016 Document Reviewed: 04/24/2015 Elsevier Interactive Patient Education  2018 [employer] Inc.   Cough, Adult A cough helps to clear your throat and lungs. A cough may last only 2-3 weeks (acute), or it may last longer than 8 weeks (chronic). Many different things can cause a cough. A cough may be a sign of an illness or another medical condition. Follow these instructions at home:  Pay attention to any changes in your cough.  Take medicines only as told by your doctor. ? If you were prescribed an antibiotic medicine, take it as told by your doctor. Do not  stop taking it even if you start to feel better. ? Talk with your doctor before you try using a cough medicine.  Drink enough fluid to keep your pee (urine) clear or pale yellow.  If the air is dry, use a cold steam vaporizer or humidifier in your home.  Stay away from things that make you cough at work or at home.  If your cough is worse at night, try using extra pillows to raise your head up higher while you sleep.  Do not smoke, and try not to be around smoke. If you need help quitting, ask your doctor.  Do not have caffeine.  Do not  drink alcohol.  Rest as needed. Contact a doctor if:  You have new problems (symptoms).  You cough up yellow fluid (pus).  Your cough does not get better after 2-3 weeks, or your cough gets worse.  Medicine does not help your cough and you are not sleeping well.  You have pain that gets worse or pain that is not helped with medicine.  You have a fever.  You are losing weight and you do not know why.  You have night sweats. Get help right away if:  You cough up blood.  You have trouble breathing.  Your heartbeat is very fast. This information is not intended to replace advice given to you by your health care provider. Make sure you discuss any questions you have with your health care provider. Document Released: 03/17/2011 Document Revised: 12/10/2015 Document Reviewed: 09/10/2014 Elsevier Interactive Patient Education  2018 [employer]    IF you received an x-ray today, you will receive an invoice from [facility]. Please contact [facility] at [phone] with questions or concerns regarding your invoice.   IF you received labwork today, you will receive an invoice from [facility]. Please contact [facility] at [phone] with questions or concerns regarding your invoice.   Our billing staff will not be able to assist you with questions regarding bills from these companies.  You will be contacted with the lab results as soon as they are available. The fastest way to get your results is to activate your My Chart account. Instructions are located on the last page of this paperwork. If you have not heard from us regarding the results in 2 weeks, please contact this office.

## 2018-03-06 NOTE — Progress Notes (Signed)
MRN: [id number] DOB: 02/25/1986  Subjective:   Kelly Gillespie is a 32 y.o. female presenting for chief complaint of Sore Throat (X 2 -3 days ); Cough; and Fatigue .  Reports 3 day history of sore throat, fatigue, and dry cough. Has had myalgias and subjective fever, has not checked temp. Has tried tessalon perles, which helped with cough, and sudafed with some relief. Sore throat is the most bothersome sx.  Denies sinus headache, sinus congestion, red eyes, ear pain, wheezing, shortness of breath, DOE, chest pain, nausea, vomiting, abdominal pain and diarrhea. Has had sick contact with mother, who was dx with mono. She drank after her this past weekend. Marland Kitchen. Has history of seasonal allergies, takes xyzal. No  history of asthma. Patient has not had flu shot this season. Denies smoking. Denies any other aggravating or relieving factors, no other questions or concerns.  Kelly Gillespie has a current medication list which includes the following prescription(s): amphetamine-dextroamphetamine, bupropion, norethindrone-ethinyl estradiol, azithromycin, benzonatate, fluoxetine, and spironolactone. Also has No Known Allergies.  Kelly Gillespie  has a past medical history of Abnormal Pap smear of cervix, Depression, and Seizures (HCC) (07/2008). Also  has a past surgical history that includes No past surgeries and Colposcopy (09/13/12).   Objective:   Vitals: BP (!) 152/115   Pulse (!) 112   Temp 100 F (37.8 C) (Oral)   Resp 20   Ht 5' 1.3" (1.557 m)   Wt 183 lb 12.8 oz (83.4 kg)   LMP 02/20/2018 (Approximate)   SpO2 98%   BMI 34.39 kg/m   Physical Exam  Constitutional: She is oriented to person, place, and time. She appears well-developed and well-nourished. No distress.  HENT:  Head: Normocephalic and atraumatic.  Right Ear: Tympanic membrane, external ear and ear canal normal.  Left Ear: Tympanic membrane, external ear and ear canal normal.  Nose: No mucosal edema. Right sinus exhibits no maxillary  sinus tenderness and no frontal sinus tenderness. Left sinus exhibits no maxillary sinus tenderness and no frontal sinus tenderness.  Mouth/Throat: Uvula is midline and mucous membranes are normal. Posterior oropharyngeal erythema ( moderatly erythematous) present. No posterior oropharyngeal edema or tonsillar abscesses. Tonsils are 1+ on the right. Tonsils are 1+ on the left. No tonsillar exudate.  Tonsils are erythematous bilaterally.  Eyes: Conjunctivae are normal.  Neck: Normal range of motion.  Cardiovascular: Normal rate, regular rhythm, normal heart sounds and intact distal pulses.  Pulmonary/Chest: Effort normal and breath sounds normal. She has no decreased breath sounds. She has no wheezes. She has no rhonchi. She has no rales.  Musculoskeletal:       Right lower leg: She exhibits no swelling.       Left lower leg: She exhibits no swelling.  Lymphadenopathy:       Head (right side): No submental, no submandibular, no tonsillar, no preauricular, no posterior auricular and no occipital adenopathy present.       Head (left side): No submental, no submandibular, no tonsillar, no preauricular, no posterior auricular and no occipital adenopathy present.    She has no cervical adenopathy.       Right: No supraclavicular adenopathy present.       Left: No supraclavicular adenopathy present.  Neurological: She is alert and oriented to person, place, and time.  Skin: Skin is warm and dry. No rash noted.  Skin is warm to palpation.   Psychiatric: She has a normal mood and affect.  Vitals reviewed.   Results for orders placed  or performed in visit on 03/06/18 (from the past 24 hour(s))  POCT rapid strep A     Status: None   Collection Time: 03/06/18 12:42 PM  Result Value Ref Range   Rapid Strep A Screen Negative Negative  POCT CBC     Status: Abnormal   Collection Time: 03/06/18  1:01 PM  Result Value Ref Range   WBC 11.4 (A) 4.6 - 10.2 K/uL   Lymph, poc 2.2 0.6 - 3.4   POC LYMPH  PERCENT 19.5 10 - 50 %L   MID (cbc) 0.4 0 - 0.9   POC MID % 3.5 0 - 12 %M   POC Granulocyte 8.8 (A) 2 - 6.9   Granulocyte percent 77.0 37 - 80 %G   RBC 5.15 4.04 - 5.48 M/uL   Hemoglobin 13.9 12.2 - 16.2 g/dL   HCT, POC 16.140.8 09.637.7 - 47.9 %   MCV 79.2 (A) 80 - 97 fL   MCH, POC 27.0 27 - 31.2 pg   MCHC 34.1 31.8 - 35.4 g/dL   RDW, POC 04.513.4 %   Platelet Count, POC 432 (A) 142 - 424 K/uL   MPV 7.0 0 - 99.8 fL  POC Influenza A&B(BINAX/QUICKVUE)     Status: None   Collection Time: 03/06/18  1:06 PM  Result Value Ref Range   Influenza A, POC Negative Negative   Influenza B, POC Negative Negative    Assessment and Plan :  1. Acute pharyngitis, unspecified etiology Rapid strep and flu negative.  Patient mildly tachycardic.  Temp 100.  WBC elevated at 11.4 with a left shift.  Suspect underlying bacterial etiology despite negative rapid strep test.  Will ceck mono labs due to recent exposure.  Patient does have a dry cough but lungs are CTAB.  She declines chest x-ray for further evaluation of lung etiology.  Recommend treatment with azithromycin for coverage for both strep and atypical lung bacteria.  Recommend follow-up in 2 days for reevaluation.  Given strict ED precautions. - azithromycin (ZITHROMAX) 250 MG tablet; Take 2 tabs PO x 1 dose, then 1 tab PO QD x 4 days  Dispense: 6 tablet; Refill: 0  2. Sore throat - POCT CBC - POCT rapid strep A - POC Influenza A&B(BINAX/QUICKVUE) - Epstein-Barr virus VCA antibody panel - Culture, Group A Strep  3. Cough - azithromycin (ZITHROMAX) 250 MG tablet; Take 2 tabs PO x 1 dose, then 1 tab PO QD x 4 days  Dispense: 6 tablet; Refill: 0 - benzonatate (TESSALON) 100 MG capsule; Take 1-2 capsules (100-200 mg total) by mouth 3 (three) times daily as needed for cough.  Dispense: 40 capsule; Refill: 0  4. Fatigue, unspecified type 5. Elevated blood pressure Likely due to current illness.  She is otherwise asx.  Recommend checking blood pressure outside  the office over the next couple weeks.  Goal is less than 140/90.  Return to office if consistently above this goal.  Side effects, risks, benefits, and alternatives of the medications and treatment plan prescribed today were discussed, and patient expressed understanding of the instructions given. No barriers to understanding were identified. Red flags discussed in detail. Pt expressed understanding regarding what to do in case of emergency/urgent symptoms.  Benjiman CoreBrittany Wiseman, PA-C  [facility] at [facility] 03/06/2018 1:13 PM

## 2018-03-07 LAB — EPSTEIN-BARR VIRUS VCA ANTIBODY PANEL
EBV NA IGG: 207 U/mL — AB (ref 0.0–17.9)
EBV VCA IGG: 113 U/mL — AB (ref 0.0–17.9)
EBV VCA IgM: <36 U/mL (ref 0.0–35.9)

## 2018-03-08 ENCOUNTER — Ambulatory Visit: Payer: BLUE CROSS/BLUE SHIELD | Admitting: Physician Assistant

## 2018-03-08 LAB — CULTURE, GROUP A STREP

## 2018-03-08 NOTE — Progress Notes (Deleted)
   Kelly CordChelsea E Mcwherter  MRN: [id number] DOB: 08/20/1985  Subjective:  Kelly Gillespie is a 32 y.o. female seen in office today for a chief complaint of follow-up on illness.  Patient last seen on 03/06/2018 for sore throat, fever, cough, and fatigue.Rapid strep and flu neg, she declined CXR, strep cx and mono test pending. WBC elevated at 11.4. Tx with azithromycin for underlying bacterial etiology. Today, pt reports ***. Denies fever, chills, sinus headache, sinus congestion, red eyes, ear pain, wheezing, shortness of breath, DOE, chest pain, nausea, vomiting, abdominal pain and diarrhea.  Review of Systems  Patient Active Problem List   Diagnosis Date Noted  . Injury of foot, left 10/26/2011    Current Outpatient Medications on File Prior to Visit  Medication Sig Dispense Refill  . amphetamine-dextroamphetamine (ADDERALL XR) 30 MG 24 hr capsule Take 30 mg by mouth 2 (two) times daily.    Marland Kitchen. azithromycin (ZITHROMAX) 250 MG tablet Take 2 tabs PO x 1 dose, then 1 tab PO QD x 4 days 6 tablet 0  . benzonatate (TESSALON) 100 MG capsule Take 1-2 capsules (100-200 mg total) by mouth 3 (three) times daily as needed for cough. 40 capsule 0  . buPROPion (WELLBUTRIN XL) 150 MG 24 hr tablet Take 1 tablet by mouth daily.  12  . FLUoxetine (PROZAC) 20 MG capsule TK 1 C PO QD  4  . norethindrone-ethinyl estradiol (MICROGESTIN FE 1/20) 1-20 MG-MCG tablet Take continuous active pills.  3 months = 4 packs 4 Package 4  . spironolactone (ALDACTONE) 50 MG tablet      No current facility-administered medications on file prior to visit.     No Known Allergies   Objective:  LMP 02/20/2018 (Approximate)   Physical Exam  Assessment and Plan :  *** There are no diagnoses linked to this encounter.   Benjiman CoreBrittany Leyton Magoon PA-C  [facility] at [facility]  [facility] 03/08/2018 6:13 AM

## 2018-03-20 NOTE — Progress Notes (Signed)
I called and left vm for pt to call office back for her lab results.

## 2018-04-04 DIAGNOSIS — I788 Other diseases of capillaries: Secondary | ICD-10-CM | POA: Diagnosis not present

## 2018-04-04 DIAGNOSIS — L700 Acne vulgaris: Secondary | ICD-10-CM | POA: Diagnosis not present

## 2018-04-09 DIAGNOSIS — Z23 Encounter for immunization: Secondary | ICD-10-CM | POA: Diagnosis not present

## 2018-05-08 DIAGNOSIS — F3342 Major depressive disorder, recurrent, in full remission: Secondary | ICD-10-CM | POA: Diagnosis not present

## 2018-05-08 DIAGNOSIS — F900 Attention-deficit hyperactivity disorder, predominantly inattentive type: Secondary | ICD-10-CM | POA: Diagnosis not present

## 2018-07-30 ENCOUNTER — Other Ambulatory Visit: Payer: Self-pay

## 2018-07-30 DIAGNOSIS — Z3041 Encounter for surveillance of contraceptive pills: Secondary | ICD-10-CM

## 2018-07-30 NOTE — Telephone Encounter (Signed)
Medication refill request: microgestine FE  Last AEX:  08/16/17 Next AEX: 08/21/18 Last MMG (if hormonal medication request):NA Refill authorized: #1 pack 0 rf to get her to her AEX

## 2018-07-31 MED ORDER — NORETHIN ACE-ETH ESTRAD-FE 1-20 MG-MCG PO TABS: ORAL_TABLET | ORAL | 0 refills | Status: DC

## 2018-08-07 ENCOUNTER — Telehealth: Payer: Self-pay | Admitting: Certified Nurse Midwife

## 2018-08-07 NOTE — Telephone Encounter (Signed)
Spoke with pharmacy, patient was sent a 30-day supply 07/31/18 to last her until her AEX 08/21/18. Closing encounter

## 2018-08-07 NOTE — Telephone Encounter (Signed)
Mail order pharmacy calling to request refill on [address]. Pharmacy number [phone]. Patient scheduled for axe 08/21/18.

## 2018-08-21 ENCOUNTER — Encounter: Payer: Self-pay | Admitting: Certified Nurse Midwife

## 2018-08-21 ENCOUNTER — Ambulatory Visit: Payer: BLUE CROSS/BLUE SHIELD | Admitting: Certified Nurse Midwife

## 2018-08-21 ENCOUNTER — Other Ambulatory Visit: Payer: Self-pay

## 2018-08-21 VITALS — BP 122/80 | HR 68 | Resp 16 | Ht 60.25 in | Wt 175.0 lb

## 2018-08-21 DIAGNOSIS — Z0000 Encounter for general adult medical examination without abnormal findings: Secondary | ICD-10-CM

## 2018-08-21 DIAGNOSIS — Z01419 Encounter for gynecological examination (general) (routine) without abnormal findings: Secondary | ICD-10-CM | POA: Diagnosis not present

## 2018-08-21 DIAGNOSIS — Z3041 Encounter for surveillance of contraceptive pills: Secondary | ICD-10-CM

## 2018-08-21 DIAGNOSIS — E118 Type 2 diabetes mellitus with unspecified complications: Secondary | ICD-10-CM | POA: Diagnosis not present

## 2018-08-21 DIAGNOSIS — R635 Abnormal weight gain: Secondary | ICD-10-CM

## 2018-08-21 DIAGNOSIS — Z30011 Encounter for initial prescription of contraceptive pills: Secondary | ICD-10-CM

## 2018-08-21 MED ORDER — NORETHINDRONE 0.35 MG PO TABS: 1.0000 | ORAL_TABLET | Freq: Every day | ORAL | 3 refills | Status: DC

## 2018-08-21 NOTE — Progress Notes (Signed)
33 y.o. G0P0000 Single  Caucasian Fe here for annual exam. Contraception working well.Periods are essentially scant to none. Headaches have increased over the past few years, migraine, but no aura prior to occurrence. Has migraine 1-2 times a month. Starts on one side only, seems like tension. Uses Excedrin Migraine and then sleep to resolve it. Sees Psychiatrist for Wellbutrin,Prozac, Adderall. Management, all stable per patient.No partner change. Screening labs today.   Patient's last menstrual period was 08/17/2018 (exact date).          Sexually active: Yes.    The current method of family planning is OCP (estrogen/progesterone).    Exercising: Yes.    walking & yoga Smoker:  no  Review of Systems  Constitutional: Negative.   HENT: Negative.   Eyes: Negative.   Respiratory: Negative.   Cardiovascular: Negative.   Gastrointestinal: Negative.   Genitourinary: Negative.   Musculoskeletal: Negative.   Skin: Negative.   Neurological: Negative.   Endo/Heme/Allergies: Negative.   Psychiatric/Behavioral: Negative.     Health Maintenance: Pap:  08-07-14 neg HPV HR neg, 08-16-17 neg HPV HR neg History of Abnormal Pap: yes MMG:  none Self Breast exams: no Colonoscopy:  none BMD:   none TDaP: 2018 Shingles: no Pneumonia: no Hep C and HIV: HIV neg 2017 Labs: yes   reports that she has never smoked. She has never used smokeless tobacco. She reports current alcohol use of about 2.0 standard drinks of alcohol per week. She reports that she does not use drugs.  Past Medical History:  Diagnosis Date  . Abnormal Pap smear of cervix   . Depression   . Seizures (HCC) 07/2008    Past Surgical History:  Procedure Laterality Date  . 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 NO PAST SURGERIES      Current Outpatient Medications  Medication Sig Dispense Refill  . amphetamine-dextroamphetamine (ADDERALL XR) 30 MG 24 hr capsule Take 30 mg by mouth 2 (two) times daily.     Marland Kitchen buPROPion (WELLBUTRIN XL) 150 MG 24 hr tablet Take 1 tablet by mouth daily.  12  . FLUoxetine (PROZAC) 20 MG capsule TK 1 C PO QD  4  . norethindrone-ethinyl estradiol (MICROGESTIN FE 1/20) 1-20 MG-MCG tablet Take continuous active pills.  3 months = 4 packs 1 Package 0   No current facility-administered medications for this visit.     Family History  Problem Relation Age of Onset  . Diabetes Maternal Grandfather   . Diabetes Paternal Grandmother   . Dementia Paternal Grandmother   . Cancer Paternal Grandfather        prostate  . Hypertension Mother     ROS:  Pertinent items are noted in HPI.  Otherwise, a comprehensive ROS was negative.  Exam:   BP 122/80   Pulse 68   Resp 16   Ht 5' 0.25" (1.53 m)   Wt 175 lb (79.4 kg)   LMP 08/17/2018 (Exact Date)   BMI 33.89 kg/m  Height: 5' 0.25" (153 cm) Ht Readings from Last 3 Encounters:  08/21/18 5' 0.25" (1.53 m)  03/06/18 5' 1.3" (1.557 m)  08/16/17 5' 0.75" (1.543 m)    General appearance: alert, cooperative and appears stated age Head: Normocephalic, without obvious abnormality, atraumatic Neck: no adenopathy, supple, symmetrical, trachea midline and thyroid normal to inspection and palpation Lungs: clear to auscultation bilaterally Breasts: normal appearance, no masses or tenderness, No nipple retraction or dimpling, No nipple discharge or  bleeding, No axillary or supraclavicular adenopathy Heart: regular rate and rhythm Abdomen: soft, non-tender; no masses,  no organomegaly Extremities: extremities normal, atraumatic, no cyanosis or edema Skin: Skin color, texture, turgor normal. No rashes or lesions Lymph nodes: Cervical, supraclavicular, and axillary nodes normal. No abnormal inguinal nodes palpated Neurologic: Grossly normal   Pelvic: External genitalia:  no lesions              Urethra:  normal appearing urethra with no masses, tenderness or lesions              Bartholin's and Skene's: normal                  Vagina: normal appearing vagina with normal color and discharge, no lesions              Cervix: no cervical motion tenderness, no lesions and nulliparous appearance              Pap taken: No. Bimanual Exam:  Uterus:  normal size, contour, position, consistency, mobility, non-tender and anteverted              Adnexa: normal adnexa and no mass, fullness, tenderness               Rectovaginal: Confirms               Anus:  normal sphincter tone, no lesions  Chaperone present: yes  A:  Well Woman with normal exam  Contraception OCP   Migraine headaches increasing, no aura  Depression with Psychiatrist management of medication  Screening labs  P:   Reviewed health and wellness pertinent to exam  Discussed concern of increasing headaches and estrogen use. Would recommend trial of POP to see if headache decreases. Patient agreeable. Bleeding profile expectation discussed. Questions addressed. Will complete current pack and follow with POP. Use condom use during first month of change, would recommend for STD prevention. Patient will advise after 3 months of use or if warning signs occur.  Continue follow up with MD as indicated.  Labs: TSH, Lipid panel, CMP, CBC  Pap smear: no   counseled on breast self exam, STD prevention, HIV risk factors and prevention, use and side effects of OCP's, adequate intake of calcium and vitamin D, diet and exercise  return annually or prn  An After Visit Summary was printed and given to the patient.

## 2018-08-22 LAB — COMPREHENSIVE METABOLIC PANEL
A/G RATIO: 1.5 (ref 1.2–2.2)
ALT: 21 IU/L (ref 0–32)
AST: 14 IU/L (ref 0–40)
Albumin: 4.1 g/dL (ref 3.8–4.8)
Alkaline Phosphatase: 122 IU/L — ABNORMAL HIGH (ref 39–117)
BILIRUBIN TOTAL: 0.3 mg/dL (ref 0.0–1.2)
BUN/Creatinine Ratio: 14 (ref 9–23)
BUN: 11 mg/dL (ref 6–20)
CALCIUM: 9 mg/dL (ref 8.7–10.2)
CHLORIDE: 100 mmol/L (ref 96–106)
CO2: 18 mmol/L — ABNORMAL LOW (ref 20–29)
Creatinine, Ser: 0.76 mg/dL (ref 0.57–1.00)
GFR calc Af Amer: 119 mL/min/{1.73_m2} (ref >59)
GFR, EST NON AFRICAN AMERICAN: 103 mL/min/{1.73_m2} (ref >59)
Globulin, Total: 2.7 g/dL (ref 1.5–4.5)
Glucose: 128 mg/dL — ABNORMAL HIGH (ref 65–99)
POTASSIUM: 3.6 mmol/L (ref 3.5–5.2)
Sodium: 137 mmol/L (ref 134–144)
Total Protein: 6.8 g/dL (ref 6.0–8.5)

## 2018-08-22 LAB — CBC
Hematocrit: 40.4 % (ref 34.0–46.6)
Hemoglobin: 13.4 g/dL (ref 11.1–15.9)
MCH: 26.9 pg (ref 26.6–33.0)
MCHC: 33.2 g/dL (ref 31.5–35.7)
MCV: 81 fL (ref 79–97)
Platelets: 391 10*3/uL (ref 150–450)
RBC: 4.99 x10E6/uL (ref 3.77–5.28)
RDW: 13.3 % (ref 11.7–15.4)
WBC: 10.8 10*3/uL (ref 3.4–10.8)

## 2018-08-22 LAB — LIPID PANEL
CHOL/HDL RATIO: 3 ratio (ref 0.0–4.4)
Cholesterol, Total: 194 mg/dL (ref 100–199)
HDL: 65 mg/dL (ref >39)
LDL CALC: 100 mg/dL — AB (ref 0–99)
TRIGLYCERIDES: 144 mg/dL (ref 0–149)
VLDL Cholesterol Cal: 29 mg/dL (ref 5–40)

## 2018-08-22 LAB — TSH: TSH: 11.16 u[IU]/mL — ABNORMAL HIGH (ref 0.450–4.500)

## 2018-08-23 ENCOUNTER — Telehealth: Payer: Self-pay | Admitting: *Deleted

## 2018-08-23 DIAGNOSIS — R7989 Other specified abnormal findings of blood chemistry: Secondary | ICD-10-CM

## 2018-08-23 DIAGNOSIS — R7309 Other abnormal glucose: Secondary | ICD-10-CM

## 2018-08-23 DIAGNOSIS — R739 Hyperglycemia, unspecified: Secondary | ICD-10-CM

## 2018-08-23 DIAGNOSIS — R748 Abnormal levels of other serum enzymes: Secondary | ICD-10-CM

## 2018-08-23 LAB — SPECIMEN STATUS REPORT

## 2018-08-23 LAB — HGB A1C W/O EAG: HEMOGLOBIN A1C: 7.3 % — AB (ref 4.8–5.6)

## 2018-08-23 NOTE — Telephone Encounter (Signed)
Patient returned call, request to proceed with referral to [facility]. Advised order placed for referral to internal medicine, [facility]. Our office referral coordinator will f/u with appt details once scheduled. Patient verbalizes understanding and is agreeable.   Routing to provider for final review. Patient is agreeable to disposition. Will close encounter.  Cc: Soundra Pilon

## 2018-08-23 NOTE — Telephone Encounter (Signed)
-----   Message from Verner Chol, CNM sent at 08/23/2018  7:30 AM EST ----- Notify patient her Lipid panel is essentially normal with only borderline elevation of LDL at 100 normal is<99  Cholesterol is 194 normal,199, Triglycerides are 144 normal, 149 HDL is 65 normal is>39 CBC is normal CMP shows normal kidney profile but elevated liver Alkaline Phosphatase which can occur from OTC medications Tylenol, Advil Her glucose was elevated at 128, Hgb A1-C was added due to this and is very elevated in the Diabetic range Her TSH is elevated also which may indicate a metabolic imbalance with this combination. She needs immediate referral to PCP due to glucose concerns and thyroid.  See if [facility] can see her.

## 2018-08-23 NOTE — Telephone Encounter (Signed)
Notes recorded by Leda Min, RN on 08/23/2018 at 11:10 AM EST Spoke with patient, advised as seen below per Leota Sauers, CNM. Patient states she wants to confirm if [facility] is covered under her insurance plan, will return call today to advise on PCP for referral. Patient verbalizes understanding and is agreeable.

## 2018-08-27 ENCOUNTER — Telehealth: Payer: Self-pay | Admitting: Certified Nurse Midwife

## 2018-08-27 NOTE — Telephone Encounter (Signed)
Patient sent the following correspondence through MyChart. Routing to triage to assist patient with request.  Hi Dr. Darcel Bayley,    I received a call last week from [place] about the results of the bloodwork we did, and she mentioned that you recommended I see someone regarding the glucose and A1C results. Noreene Larsson mentioned that she would have someone schedule an appointment for me and send over my records but I haven't received a call back or any information to confirm that an appointment was made.   If I need to book the appointment myself, I can do that, just let me know.    Thanks,  Kelly Gillespie

## 2018-08-27 NOTE — Telephone Encounter (Signed)
Routing to [name] to f/u with referral appointment.

## 2018-08-28 NOTE — Telephone Encounter (Signed)
Call placed to patient in reference to a referral to Dr. Evlyn KannerSouth. Referral was faxed on 08/23/18 to Dr. Rinaldo CloudSouth's office. I have also placed a call  and left a message for Seward GraterMaggie the new patient coordinator to confirm the referral was received and schedule the appointment.

## 2018-09-05 NOTE — Telephone Encounter (Signed)
Patient calling to speak with [facility]. Dr Rinaldo Cloud office has not called her for the appointment.

## 2018-09-17 NOTE — Telephone Encounter (Signed)
Margee from [facility] called to let our office know the patient is scheduled with Dr. Evlyn Kanner on 11/20/2018 at 2:30 PM. The patient is aware.

## 2018-09-18 ENCOUNTER — Other Ambulatory Visit: Payer: Self-pay | Admitting: *Deleted

## 2018-09-18 DIAGNOSIS — R748 Abnormal levels of other serum enzymes: Secondary | ICD-10-CM

## 2018-09-18 DIAGNOSIS — R739 Hyperglycemia, unspecified: Secondary | ICD-10-CM

## 2018-09-18 DIAGNOSIS — R7309 Other abnormal glucose: Secondary | ICD-10-CM

## 2018-09-18 DIAGNOSIS — R7989 Other specified abnormal findings of blood chemistry: Secondary | ICD-10-CM

## 2018-09-20 DIAGNOSIS — E039 Hypothyroidism, unspecified: Secondary | ICD-10-CM | POA: Diagnosis not present

## 2018-10-30 DIAGNOSIS — F900 Attention-deficit hyperactivity disorder, predominantly inattentive type: Secondary | ICD-10-CM | POA: Diagnosis not present

## 2018-10-30 DIAGNOSIS — F3342 Major depressive disorder, recurrent, in full remission: Secondary | ICD-10-CM | POA: Diagnosis not present

## 2018-11-01 DIAGNOSIS — E039 Hypothyroidism, unspecified: Secondary | ICD-10-CM | POA: Diagnosis not present

## 2018-11-07 DIAGNOSIS — E039 Hypothyroidism, unspecified: Secondary | ICD-10-CM | POA: Diagnosis not present

## 2018-11-07 DIAGNOSIS — E119 Type 2 diabetes mellitus without complications: Secondary | ICD-10-CM | POA: Diagnosis not present

## 2018-11-07 DIAGNOSIS — Z6831 Body mass index (BMI) 31.0-31.9, adult: Secondary | ICD-10-CM | POA: Diagnosis not present

## 2018-11-08 DIAGNOSIS — E119 Type 2 diabetes mellitus without complications: Secondary | ICD-10-CM | POA: Diagnosis not present

## 2018-11-28 DIAGNOSIS — Z6831 Body mass index (BMI) 31.0-31.9, adult: Secondary | ICD-10-CM | POA: Diagnosis not present

## 2018-11-28 DIAGNOSIS — E119 Type 2 diabetes mellitus without complications: Secondary | ICD-10-CM | POA: Diagnosis not present

## 2018-11-28 DIAGNOSIS — E039 Hypothyroidism, unspecified: Secondary | ICD-10-CM | POA: Diagnosis not present

## 2019-01-02 DIAGNOSIS — Z6831 Body mass index (BMI) 31.0-31.9, adult: Secondary | ICD-10-CM | POA: Diagnosis not present

## 2019-01-02 DIAGNOSIS — E039 Hypothyroidism, unspecified: Secondary | ICD-10-CM | POA: Diagnosis not present

## 2019-01-02 DIAGNOSIS — E119 Type 2 diabetes mellitus without complications: Secondary | ICD-10-CM | POA: Diagnosis not present

## 2019-04-15 DIAGNOSIS — E039 Hypothyroidism, unspecified: Secondary | ICD-10-CM | POA: Diagnosis not present

## 2019-04-15 DIAGNOSIS — E119 Type 2 diabetes mellitus without complications: Secondary | ICD-10-CM | POA: Diagnosis not present

## 2019-04-22 DIAGNOSIS — E039 Hypothyroidism, unspecified: Secondary | ICD-10-CM | POA: Diagnosis not present

## 2019-04-22 DIAGNOSIS — E119 Type 2 diabetes mellitus without complications: Secondary | ICD-10-CM | POA: Diagnosis not present

## 2019-05-23 DIAGNOSIS — J209 Acute bronchitis, unspecified: Secondary | ICD-10-CM | POA: Diagnosis not present

## 2019-05-30 ENCOUNTER — Other Ambulatory Visit: Payer: Self-pay | Admitting: Certified Nurse Midwife

## 2019-05-30 DIAGNOSIS — Z30011 Encounter for initial prescription of contraceptive pills: Secondary | ICD-10-CM

## 2019-05-30 NOTE — Telephone Encounter (Signed)
Medication refill request: Micronor  Last AEX:  08/21/18 DL Next AEX: 08/27/19 Last MMG (if hormonal medication request): n/a Refill authorized: Please advise on refill

## 2019-06-20 DIAGNOSIS — F900 Attention-deficit hyperactivity disorder, predominantly inattentive type: Secondary | ICD-10-CM | POA: Diagnosis not present

## 2019-07-29 DIAGNOSIS — E039 Hypothyroidism, unspecified: Secondary | ICD-10-CM | POA: Diagnosis not present

## 2019-07-29 DIAGNOSIS — E119 Type 2 diabetes mellitus without complications: Secondary | ICD-10-CM | POA: Diagnosis not present

## 2019-08-05 DIAGNOSIS — Z7189 Other specified counseling: Secondary | ICD-10-CM | POA: Diagnosis not present

## 2019-08-05 DIAGNOSIS — Z6830 Body mass index (BMI) 30.0-30.9, adult: Secondary | ICD-10-CM | POA: Diagnosis not present

## 2019-08-05 DIAGNOSIS — E119 Type 2 diabetes mellitus without complications: Secondary | ICD-10-CM | POA: Diagnosis not present

## 2019-08-05 DIAGNOSIS — E039 Hypothyroidism, unspecified: Secondary | ICD-10-CM | POA: Diagnosis not present

## 2019-08-27 ENCOUNTER — Ambulatory Visit: Payer: Self-pay | Admitting: Certified Nurse Midwife

## 2019-08-28 ENCOUNTER — Other Ambulatory Visit: Payer: Self-pay | Admitting: Certified Nurse Midwife

## 2019-08-28 DIAGNOSIS — Z30011 Encounter for initial prescription of contraceptive pills: Secondary | ICD-10-CM

## 2019-08-28 NOTE — Telephone Encounter (Signed)
Will hold refill to see if patient is contacted

## 2019-08-28 NOTE — Telephone Encounter (Signed)
Medication refill request: micronor Last AEX:  08-21-2018 DL  Next AEX: not scheduled Last MMG (if hormonal medication request): n/a Refill authorized: Today, please advise.   Attempted to reach patient to schedule aex. Voicemail full, unable to leave a message.   Medication pended for #28, 0RF. Please refill if appropriate.

## 2019-10-04 ENCOUNTER — Encounter: Payer: Self-pay | Admitting: Certified Nurse Midwife

## 2019-11-04 DIAGNOSIS — E119 Type 2 diabetes mellitus without complications: Secondary | ICD-10-CM | POA: Diagnosis not present

## 2019-11-04 DIAGNOSIS — E039 Hypothyroidism, unspecified: Secondary | ICD-10-CM | POA: Diagnosis not present

## 2019-11-07 DIAGNOSIS — E119 Type 2 diabetes mellitus without complications: Secondary | ICD-10-CM | POA: Diagnosis not present

## 2019-11-07 DIAGNOSIS — E039 Hypothyroidism, unspecified: Secondary | ICD-10-CM | POA: Diagnosis not present

## 2019-11-07 DIAGNOSIS — Z7189 Other specified counseling: Secondary | ICD-10-CM | POA: Diagnosis not present

## 2019-12-05 DIAGNOSIS — F900 Attention-deficit hyperactivity disorder, predominantly inattentive type: Secondary | ICD-10-CM | POA: Diagnosis not present

## 2019-12-05 DIAGNOSIS — F3342 Major depressive disorder, recurrent, in full remission: Secondary | ICD-10-CM | POA: Diagnosis not present

## 2020-02-03 DIAGNOSIS — E119 Type 2 diabetes mellitus without complications: Secondary | ICD-10-CM | POA: Diagnosis not present

## 2020-02-03 DIAGNOSIS — E039 Hypothyroidism, unspecified: Secondary | ICD-10-CM | POA: Diagnosis not present

## 2020-02-06 DIAGNOSIS — E039 Hypothyroidism, unspecified: Secondary | ICD-10-CM | POA: Diagnosis not present

## 2020-02-06 DIAGNOSIS — E119 Type 2 diabetes mellitus without complications: Secondary | ICD-10-CM | POA: Diagnosis not present

## 2020-05-12 DIAGNOSIS — E039 Hypothyroidism, unspecified: Secondary | ICD-10-CM | POA: Diagnosis not present

## 2020-05-12 DIAGNOSIS — E119 Type 2 diabetes mellitus without complications: Secondary | ICD-10-CM | POA: Diagnosis not present

## 2020-06-30 DIAGNOSIS — F900 Attention-deficit hyperactivity disorder, predominantly inattentive type: Secondary | ICD-10-CM | POA: Diagnosis not present

## 2020-06-30 DIAGNOSIS — F3342 Major depressive disorder, recurrent, in full remission: Secondary | ICD-10-CM | POA: Diagnosis not present

## 2020-09-07 DIAGNOSIS — E119 Type 2 diabetes mellitus without complications: Secondary | ICD-10-CM | POA: Diagnosis not present

## 2020-09-07 DIAGNOSIS — E039 Hypothyroidism, unspecified: Secondary | ICD-10-CM | POA: Diagnosis not present

## 2020-09-14 DIAGNOSIS — E119 Type 2 diabetes mellitus without complications: Secondary | ICD-10-CM | POA: Diagnosis not present

## 2020-09-14 DIAGNOSIS — E039 Hypothyroidism, unspecified: Secondary | ICD-10-CM | POA: Diagnosis not present

## 2020-12-22 DIAGNOSIS — F900 Attention-deficit hyperactivity disorder, predominantly inattentive type: Secondary | ICD-10-CM | POA: Diagnosis not present

## 2020-12-22 DIAGNOSIS — F3342 Major depressive disorder, recurrent, in full remission: Secondary | ICD-10-CM | POA: Diagnosis not present

## 2021-01-12 DIAGNOSIS — E039 Hypothyroidism, unspecified: Secondary | ICD-10-CM | POA: Diagnosis not present

## 2021-01-12 DIAGNOSIS — E119 Type 2 diabetes mellitus without complications: Secondary | ICD-10-CM | POA: Diagnosis not present

## 2021-04-07 DIAGNOSIS — E119 Type 2 diabetes mellitus without complications: Secondary | ICD-10-CM | POA: Diagnosis not present

## 2021-04-07 DIAGNOSIS — E039 Hypothyroidism, unspecified: Secondary | ICD-10-CM | POA: Diagnosis not present

## 2021-04-14 DIAGNOSIS — E039 Hypothyroidism, unspecified: Secondary | ICD-10-CM | POA: Diagnosis not present

## 2021-04-14 DIAGNOSIS — E119 Type 2 diabetes mellitus without complications: Secondary | ICD-10-CM | POA: Diagnosis not present

## 2021-06-16 DIAGNOSIS — L209 Atopic dermatitis, unspecified: Secondary | ICD-10-CM | POA: Diagnosis not present

## 2021-06-17 DIAGNOSIS — F900 Attention-deficit hyperactivity disorder, predominantly inattentive type: Secondary | ICD-10-CM | POA: Diagnosis not present

## 2021-06-17 DIAGNOSIS — F3342 Major depressive disorder, recurrent, in full remission: Secondary | ICD-10-CM | POA: Diagnosis not present

## 2021-06-19 ENCOUNTER — Encounter (HOSPITAL_COMMUNITY): Admission: EM | Disposition: E | Payer: Self-pay | Source: Home / Self Care | Attending: Neurosurgery

## 2021-06-19 ENCOUNTER — Emergency Department (HOSPITAL_COMMUNITY): Payer: BC Managed Care – PPO

## 2021-06-19 ENCOUNTER — Inpatient Hospital Stay (HOSPITAL_COMMUNITY): Payer: BC Managed Care – PPO

## 2021-06-19 ENCOUNTER — Inpatient Hospital Stay (HOSPITAL_COMMUNITY)
Admission: EM | Admit: 2021-06-19 | Discharge: 2021-07-18 | DRG: 020 | Disposition: E | Payer: BC Managed Care – PPO | Attending: Neurosurgery | Admitting: Neurosurgery

## 2021-06-19 ENCOUNTER — Inpatient Hospital Stay (HOSPITAL_COMMUNITY): Payer: BC Managed Care – PPO | Admitting: Certified Registered Nurse Anesthetist

## 2021-06-19 ENCOUNTER — Other Ambulatory Visit: Payer: Self-pay

## 2021-06-19 DIAGNOSIS — E039 Hypothyroidism, unspecified: Secondary | ICD-10-CM | POA: Diagnosis present

## 2021-06-19 DIAGNOSIS — G911 Obstructive hydrocephalus: Secondary | ICD-10-CM | POA: Diagnosis not present

## 2021-06-19 DIAGNOSIS — Z20822 Contact with and (suspected) exposure to covid-19: Secondary | ICD-10-CM | POA: Diagnosis present

## 2021-06-19 DIAGNOSIS — Z452 Encounter for adjustment and management of vascular access device: Secondary | ICD-10-CM

## 2021-06-19 DIAGNOSIS — I2699 Other pulmonary embolism without acute cor pulmonale: Secondary | ICD-10-CM | POA: Diagnosis not present

## 2021-06-19 DIAGNOSIS — I629 Nontraumatic intracranial hemorrhage, unspecified: Secondary | ICD-10-CM | POA: Diagnosis not present

## 2021-06-19 DIAGNOSIS — G914 Hydrocephalus in diseases classified elsewhere: Secondary | ICD-10-CM | POA: Diagnosis not present

## 2021-06-19 DIAGNOSIS — J15211 Pneumonia due to Methicillin susceptible Staphylococcus aureus: Secondary | ICD-10-CM | POA: Diagnosis not present

## 2021-06-19 DIAGNOSIS — J9690 Respiratory failure, unspecified, unspecified whether with hypoxia or hypercapnia: Secondary | ICD-10-CM | POA: Diagnosis not present

## 2021-06-19 DIAGNOSIS — I67848 Other cerebrovascular vasospasm and vasoconstriction: Secondary | ICD-10-CM | POA: Diagnosis not present

## 2021-06-19 DIAGNOSIS — I4892 Unspecified atrial flutter: Secondary | ICD-10-CM | POA: Diagnosis not present

## 2021-06-19 DIAGNOSIS — Z66 Do not resuscitate: Secondary | ICD-10-CM | POA: Diagnosis not present

## 2021-06-19 DIAGNOSIS — Z8679 Personal history of other diseases of the circulatory system: Secondary | ICD-10-CM | POA: Diagnosis not present

## 2021-06-19 DIAGNOSIS — F32A Depression, unspecified: Secondary | ICD-10-CM | POA: Diagnosis present

## 2021-06-19 DIAGNOSIS — R569 Unspecified convulsions: Secondary | ICD-10-CM | POA: Diagnosis present

## 2021-06-19 DIAGNOSIS — Z794 Long term (current) use of insulin: Secondary | ICD-10-CM

## 2021-06-19 DIAGNOSIS — G9382 Brain death: Secondary | ICD-10-CM | POA: Diagnosis not present

## 2021-06-19 DIAGNOSIS — Z529 Donor of unspecified organ or tissue: Secondary | ICD-10-CM | POA: Diagnosis not present

## 2021-06-19 DIAGNOSIS — I606 Nontraumatic subarachnoid hemorrhage from other intracranial arteries: Principal | ICD-10-CM | POA: Diagnosis present

## 2021-06-19 DIAGNOSIS — S066X0A Traumatic subarachnoid hemorrhage without loss of consciousness, initial encounter: Secondary | ICD-10-CM | POA: Diagnosis not present

## 2021-06-19 DIAGNOSIS — J9601 Acute respiratory failure with hypoxia: Secondary | ICD-10-CM | POA: Diagnosis present

## 2021-06-19 DIAGNOSIS — E876 Hypokalemia: Secondary | ICD-10-CM | POA: Diagnosis present

## 2021-06-19 DIAGNOSIS — J690 Pneumonitis due to inhalation of food and vomit: Secondary | ICD-10-CM | POA: Diagnosis not present

## 2021-06-19 DIAGNOSIS — S066XAA Traumatic subarachnoid hemorrhage with loss of consciousness status unknown, initial encounter: Secondary | ICD-10-CM | POA: Diagnosis not present

## 2021-06-19 DIAGNOSIS — J9811 Atelectasis: Secondary | ICD-10-CM | POA: Diagnosis present

## 2021-06-19 DIAGNOSIS — E1165 Type 2 diabetes mellitus with hyperglycemia: Secondary | ICD-10-CM | POA: Diagnosis present

## 2021-06-19 DIAGNOSIS — Z4682 Encounter for fitting and adjustment of non-vascular catheter: Secondary | ICD-10-CM | POA: Diagnosis not present

## 2021-06-19 DIAGNOSIS — I611 Nontraumatic intracerebral hemorrhage in hemisphere, cortical: Secondary | ICD-10-CM | POA: Diagnosis not present

## 2021-06-19 DIAGNOSIS — I609 Nontraumatic subarachnoid hemorrhage, unspecified: Principal | ICD-10-CM

## 2021-06-19 DIAGNOSIS — G43909 Migraine, unspecified, not intractable, without status migrainosus: Secondary | ICD-10-CM | POA: Diagnosis present

## 2021-06-19 DIAGNOSIS — G936 Cerebral edema: Secondary | ICD-10-CM | POA: Diagnosis not present

## 2021-06-19 DIAGNOSIS — I671 Cerebral aneurysm, nonruptured: Secondary | ICD-10-CM | POA: Diagnosis not present

## 2021-06-19 DIAGNOSIS — I615 Nontraumatic intracerebral hemorrhage, intraventricular: Secondary | ICD-10-CM | POA: Diagnosis not present

## 2021-06-19 DIAGNOSIS — Z79899 Other long term (current) drug therapy: Secondary | ICD-10-CM

## 2021-06-19 DIAGNOSIS — J984 Other disorders of lung: Secondary | ICD-10-CM | POA: Diagnosis not present

## 2021-06-19 DIAGNOSIS — J988 Other specified respiratory disorders: Secondary | ICD-10-CM | POA: Diagnosis not present

## 2021-06-19 DIAGNOSIS — R918 Other nonspecific abnormal finding of lung field: Secondary | ICD-10-CM | POA: Diagnosis not present

## 2021-06-19 DIAGNOSIS — Z793 Long term (current) use of hormonal contraceptives: Secondary | ICD-10-CM

## 2021-06-19 DIAGNOSIS — Z87828 Personal history of other (healed) physical injury and trauma: Secondary | ICD-10-CM | POA: Diagnosis not present

## 2021-06-19 DIAGNOSIS — Z79890 Hormone replacement therapy (postmenopausal): Secondary | ICD-10-CM

## 2021-06-19 DIAGNOSIS — J9602 Acute respiratory failure with hypercapnia: Secondary | ICD-10-CM | POA: Diagnosis present

## 2021-06-19 DIAGNOSIS — I161 Hypertensive emergency: Secondary | ICD-10-CM | POA: Diagnosis present

## 2021-06-19 DIAGNOSIS — I959 Hypotension, unspecified: Secondary | ICD-10-CM | POA: Diagnosis not present

## 2021-06-19 DIAGNOSIS — Z9911 Dependence on respirator [ventilator] status: Secondary | ICD-10-CM

## 2021-06-19 DIAGNOSIS — S99922A Unspecified injury of left foot, initial encounter: Secondary | ICD-10-CM | POA: Diagnosis not present

## 2021-06-19 DIAGNOSIS — Z515 Encounter for palliative care: Secondary | ICD-10-CM | POA: Diagnosis not present

## 2021-06-19 DIAGNOSIS — I2693 Single subsegmental pulmonary embolism without acute cor pulmonale: Secondary | ICD-10-CM | POA: Diagnosis present

## 2021-06-19 DIAGNOSIS — G9389 Other specified disorders of brain: Secondary | ICD-10-CM | POA: Diagnosis present

## 2021-06-19 DIAGNOSIS — R0989 Other specified symptoms and signs involving the circulatory and respiratory systems: Secondary | ICD-10-CM | POA: Diagnosis not present

## 2021-06-19 DIAGNOSIS — D6489 Other specified anemias: Secondary | ICD-10-CM | POA: Diagnosis not present

## 2021-06-19 DIAGNOSIS — R0902 Hypoxemia: Secondary | ICD-10-CM

## 2021-06-19 DIAGNOSIS — E119 Type 2 diabetes mellitus without complications: Secondary | ICD-10-CM | POA: Diagnosis not present

## 2021-06-19 DIAGNOSIS — Z8249 Family history of ischemic heart disease and other diseases of the circulatory system: Secondary | ICD-10-CM

## 2021-06-19 DIAGNOSIS — R519 Headache, unspecified: Secondary | ICD-10-CM | POA: Diagnosis not present

## 2021-06-19 DIAGNOSIS — G919 Hydrocephalus, unspecified: Secondary | ICD-10-CM | POA: Diagnosis not present

## 2021-06-19 DIAGNOSIS — R34 Anuria and oliguria: Secondary | ICD-10-CM | POA: Diagnosis present

## 2021-06-19 DIAGNOSIS — E8770 Fluid overload, unspecified: Secondary | ICD-10-CM | POA: Diagnosis not present

## 2021-06-19 DIAGNOSIS — I6031 Nontraumatic subarachnoid hemorrhage from right posterior communicating artery: Secondary | ICD-10-CM | POA: Diagnosis not present

## 2021-06-19 DIAGNOSIS — Z0189 Encounter for other specified special examinations: Secondary | ICD-10-CM

## 2021-06-19 DIAGNOSIS — Z87410 Personal history of cervical dysplasia: Secondary | ICD-10-CM

## 2021-06-19 DIAGNOSIS — Z833 Family history of diabetes mellitus: Secondary | ICD-10-CM

## 2021-06-19 DIAGNOSIS — Z7984 Long term (current) use of oral hypoglycemic drugs: Secondary | ICD-10-CM

## 2021-06-19 DIAGNOSIS — I6200 Nontraumatic subdural hemorrhage, unspecified: Secondary | ICD-10-CM | POA: Diagnosis not present

## 2021-06-19 HISTORY — DX: Type 2 diabetes mellitus without complications: E11.9

## 2021-06-19 HISTORY — PX: IR 3D INDEPENDENT WKST: IMG2385

## 2021-06-19 HISTORY — PX: IR ANGIO VERTEBRAL SEL VERTEBRAL BILAT MOD SED: IMG5369

## 2021-06-19 HISTORY — PX: IR ANGIO INTRA EXTRACRAN SEL INTERNAL CAROTID BILAT MOD SED: IMG5363

## 2021-06-19 HISTORY — PX: RADIOLOGY WITH ANESTHESIA: SHX6223

## 2021-06-19 LAB — CBC
HCT: 41.1 % (ref 36.0–46.0)
Hemoglobin: 13.9 g/dL (ref 12.0–15.0)
MCH: 28.1 pg (ref 26.0–34.0)
MCHC: 33.8 g/dL (ref 30.0–36.0)
MCV: 83.2 fL (ref 80.0–100.0)
Platelets: 346 10*3/uL (ref 150–400)
RBC: 4.94 MIL/uL (ref 3.87–5.11)
RDW: 12.2 % (ref 11.5–15.5)
WBC: 16.2 10*3/uL — ABNORMAL HIGH (ref 4.0–10.5)
nRBC: 0 % (ref 0.0–0.2)

## 2021-06-19 LAB — BASIC METABOLIC PANEL
Anion gap: 6 (ref 5–15)
BUN: 7 mg/dL (ref 6–20)
CO2: 24 mmol/L (ref 22–32)
Calcium: 7.7 mg/dL — ABNORMAL LOW (ref 8.9–10.3)
Chloride: 109 mmol/L (ref 98–111)
Creatinine, Ser: 0.56 mg/dL (ref 0.44–1.00)
GFR, Estimated: >60 mL/min (ref >60)
Glucose, Bld: 91 mg/dL (ref 70–99)
Potassium: 3.6 mmol/L (ref 3.5–5.1)
Sodium: 139 mmol/L (ref 135–145)

## 2021-06-19 LAB — RESP PANEL BY RT-PCR (FLU A&B, COVID) ARPGX2
Influenza A by PCR: NEGATIVE
Influenza B by PCR: NEGATIVE
SARS Coronavirus 2 by RT PCR: NEGATIVE

## 2021-06-19 LAB — I-STAT ARTERIAL BLOOD GAS, ED
Acid-Base Excess: 0 mmol/L (ref 0.0–2.0)
Bicarbonate: 23.9 mmol/L (ref 20.0–28.0)
Calcium, Ion: 1.1 mmol/L — ABNORMAL LOW (ref 1.15–1.40)
HCT: 42 % (ref 36.0–46.0)
Hemoglobin: 14.3 g/dL (ref 12.0–15.0)
O2 Saturation: 100 %
Patient temperature: 98.6
Potassium: 2.7 mmol/L — CL (ref 3.5–5.1)
Sodium: 138 mmol/L (ref 135–145)
TCO2: 25 mmol/L (ref 22–32)
pCO2 arterial: 35.5 mmHg (ref 32.0–48.0)
pH, Arterial: 7.436 (ref 7.350–7.450)
pO2, Arterial: 211 mmHg — ABNORMAL HIGH (ref 83.0–108.0)

## 2021-06-19 LAB — COMPREHENSIVE METABOLIC PANEL
ALT: 12 U/L (ref 0–44)
AST: 22 U/L (ref 15–41)
Albumin: 3.8 g/dL (ref 3.5–5.0)
Alkaline Phosphatase: 82 U/L (ref 38–126)
Anion gap: 12 (ref 5–15)
BUN: 9 mg/dL (ref 6–20)
CO2: 24 mmol/L (ref 22–32)
Calcium: 8.7 mg/dL — ABNORMAL LOW (ref 8.9–10.3)
Chloride: 101 mmol/L (ref 98–111)
Creatinine, Ser: 0.92 mg/dL (ref 0.44–1.00)
GFR, Estimated: >60 mL/min (ref >60)
Glucose, Bld: 287 mg/dL — ABNORMAL HIGH (ref 70–99)
Potassium: 2.6 mmol/L — CL (ref 3.5–5.1)
Sodium: 137 mmol/L (ref 135–145)
Total Bilirubin: 0.2 mg/dL — ABNORMAL LOW (ref 0.3–1.2)
Total Protein: 6.8 g/dL (ref 6.5–8.1)

## 2021-06-19 LAB — GLUCOSE, CAPILLARY
Glucose-Capillary: 284 mg/dL — ABNORMAL HIGH (ref 70–99)
Glucose-Capillary: 167 mg/dL — ABNORMAL HIGH (ref 70–99)
Glucose-Capillary: 85 mg/dL (ref 70–99)
Glucose-Capillary: 97 mg/dL (ref 70–99)
Glucose-Capillary: 116 mg/dL — ABNORMAL HIGH (ref 70–99)

## 2021-06-19 LAB — CBG MONITORING, ED: Glucose-Capillary: 262 mg/dL — ABNORMAL HIGH (ref 70–99)

## 2021-06-19 LAB — DIFFERENTIAL
Abs Immature Granulocytes: 0.09 10*3/uL — ABNORMAL HIGH (ref 0.00–0.07)
Basophils Absolute: 0 10*3/uL (ref 0.0–0.1)
Basophils Relative: 0 %
Eosinophils Absolute: 0 10*3/uL (ref 0.0–0.5)
Eosinophils Relative: 0 %
Immature Granulocytes: 1 %
Lymphocytes Relative: 13 %
Lymphs Abs: 2.1 10*3/uL (ref 0.7–4.0)
Monocytes Absolute: 0.7 10*3/uL (ref 0.1–1.0)
Monocytes Relative: 4 %
Neutro Abs: 13.2 10*3/uL — ABNORMAL HIGH (ref 1.7–7.7)
Neutrophils Relative %: 82 %

## 2021-06-19 LAB — I-STAT BETA HCG BLOOD, ED (MC, WL, AP ONLY): I-stat hCG, quantitative: <5 m[IU]/mL (ref <5)

## 2021-06-19 LAB — PROTIME-INR
INR: 1.1 (ref 0.8–1.2)
Prothrombin Time: 13.8 seconds (ref 11.4–15.2)

## 2021-06-19 LAB — I-STAT CHEM 8, ED
BUN: 9 mg/dL (ref 6–20)
Calcium, Ion: 1.03 mmol/L — ABNORMAL LOW (ref 1.15–1.40)
Chloride: 105 mmol/L (ref 98–111)
Creatinine, Ser: 0.5 mg/dL (ref 0.44–1.00)
Glucose, Bld: 260 mg/dL — ABNORMAL HIGH (ref 70–99)
HCT: 39 % (ref 36.0–46.0)
Hemoglobin: 13.3 g/dL (ref 12.0–15.0)
Potassium: 2.4 mmol/L — CL (ref 3.5–5.1)
Sodium: 141 mmol/L (ref 135–145)
TCO2: 23 mmol/L (ref 22–32)

## 2021-06-19 LAB — MAGNESIUM
Magnesium: 1.7 mg/dL (ref 1.7–2.4)
Magnesium: 1.7 mg/dL (ref 1.7–2.4)

## 2021-06-19 LAB — HEMOGLOBIN A1C
Hgb A1c MFr Bld: 6.4 % — ABNORMAL HIGH (ref 4.8–5.6)
Mean Plasma Glucose: 136.98 mg/dL

## 2021-06-19 LAB — PHOSPHORUS
Phosphorus: 2.7 mg/dL (ref 2.5–4.6)
Phosphorus: 3.4 mg/dL (ref 2.5–4.6)

## 2021-06-19 LAB — MRSA NEXT GEN BY PCR, NASAL: MRSA by PCR Next Gen: NOT DETECTED

## 2021-06-19 LAB — APTT: aPTT: 21 seconds — ABNORMAL LOW (ref 24–36)

## 2021-06-19 SURGERY — IR WITH ANESTHESIA
Anesthesia: General

## 2021-06-19 MED ORDER — ASPIRIN 325 MG PO TABS: 325.0000 mg | ORAL_TABLET | Freq: Every day | ORAL | Status: DC

## 2021-06-19 MED ORDER — IOPAMIDOL (ISOVUE-370) INJECTION 76%
75.0000 mL | Freq: Once | INTRAVENOUS | Status: AC | PRN
  Administered 2021-06-19: 75 mL via INTRAVENOUS

## 2021-06-19 MED ORDER — LEVOTHYROXINE SODIUM 75 MCG PO TABS
75.0000 ug | ORAL_TABLET | Freq: Every morning | ORAL | Status: DC
  Administered 2021-06-20 – 2021-06-29 (×10): 75 ug
  Filled 2021-06-19 (×10): qty 1

## 2021-06-19 MED ORDER — FENTANYL BOLUS VIA INFUSION
50.0000 ug | INTRAVENOUS | Status: DC | PRN
  Filled 2021-06-19: qty 50

## 2021-06-19 MED ORDER — PROPOFOL 1000 MG/100ML IV EMUL
0.0000 ug/kg/min | INTRAVENOUS | Status: DC
  Administered 2021-06-19 (×3): 50 ug/kg/min via INTRAVENOUS
  Administered 2021-06-19: 10 ug/kg/min via INTRAVENOUS
  Administered 2021-06-20: 09:00:00 40 ug/kg/min via INTRAVENOUS
  Administered 2021-06-20: 16:00:00 30 ug/kg/min via INTRAVENOUS
  Administered 2021-06-20: 03:00:00 50 ug/kg/min via INTRAVENOUS
  Administered 2021-06-21: 10 ug/kg/min via INTRAVENOUS
  Administered 2021-06-21: 40 ug/kg/min via INTRAVENOUS
  Administered 2021-06-22: 15 ug/kg/min via INTRAVENOUS
  Administered 2021-06-22: 10 ug/kg/min via INTRAVENOUS
  Administered 2021-06-23: 15 ug/kg/min via INTRAVENOUS
  Administered 2021-06-23: 25 ug/kg/min via INTRAVENOUS
  Administered 2021-06-24 (×2): 20 ug/kg/min via INTRAVENOUS
  Administered 2021-06-25: 25 ug/kg/min via INTRAVENOUS
  Administered 2021-06-26: 15 ug/kg/min via INTRAVENOUS
  Administered 2021-06-26: 20 ug/kg/min via INTRAVENOUS
  Administered 2021-06-26: 30 ug/kg/min via INTRAVENOUS
  Filled 2021-06-19 (×19): qty 100

## 2021-06-19 MED ORDER — ASPIRIN 81 MG PO CHEW
81.0000 mg | CHEWABLE_TABLET | Freq: Every day | ORAL | Status: DC
  Administered 2021-06-20 – 2021-06-29 (×10): 81 mg
  Filled 2021-06-19 (×10): qty 1

## 2021-06-19 MED ORDER — POTASSIUM CHLORIDE 20 MEQ PO PACK
40.0000 meq | PACK | Freq: Once | ORAL | Status: AC
  Administered 2021-06-19: 40 meq
  Filled 2021-06-19: qty 2

## 2021-06-19 MED ORDER — SODIUM CHLORIDE 0.9 % IV SOLN: INTRAVENOUS | Status: DC | PRN

## 2021-06-19 MED ORDER — CLEVIDIPINE BUTYRATE 0.5 MG/ML IV EMUL
0.0000 mg/h | INTRAVENOUS | Status: DC
  Administered 2021-06-19: 21 mg/h via INTRAVENOUS
  Administered 2021-06-19: 2 mg/h via INTRAVENOUS
  Administered 2021-06-19: 15:00:00 6 mg/h via INTRAVENOUS
  Administered 2021-06-19: 3 mg/h via INTRAVENOUS
  Administered 2021-06-19: 6 mg/h via INTRAVENOUS
  Administered 2021-06-20: 15:00:00 10 mg/h via INTRAVENOUS
  Administered 2021-06-20 (×2): 11 mg/h via INTRAVENOUS
  Administered 2021-06-20: 09:00:00 4 mg/h via INTRAVENOUS
  Administered 2021-06-20: 18:00:00 10 mg/h via INTRAVENOUS
  Administered 2021-06-21: 11 mg/h via INTRAVENOUS
  Administered 2021-06-21 (×2): 4 mg/h via INTRAVENOUS
  Administered 2021-06-21 (×2): 7 mg/h via INTRAVENOUS
  Administered 2021-06-21: 11 mg/h via INTRAVENOUS
  Administered 2021-06-21: 9 mg/h via INTRAVENOUS
  Filled 2021-06-19 (×16): qty 50

## 2021-06-19 MED ORDER — TICAGRELOR 90 MG PO TABS: 180.0000 mg | ORAL_TABLET | Freq: Once | ORAL | Status: DC

## 2021-06-19 MED ORDER — FENTANYL CITRATE PF 50 MCG/ML IJ SOSY: 50.0000 ug | PREFILLED_SYRINGE | Freq: Once | INTRAMUSCULAR | Status: DC

## 2021-06-19 MED ORDER — CHLORHEXIDINE GLUCONATE 0.12% ORAL RINSE (MEDLINE KIT)
15.0000 mL | Freq: Two times a day (BID) | OROMUCOSAL | Status: DC
  Administered 2021-06-19 – 2021-06-29 (×19): 15 mL via OROMUCOSAL

## 2021-06-19 MED ORDER — TICAGRELOR 90 MG PO TABS: 90.0000 mg | ORAL_TABLET | Freq: Two times a day (BID) | ORAL | Status: DC

## 2021-06-19 MED ORDER — IOHEXOL 300 MG/ML  SOLN
100.0000 mL | Freq: Once | INTRAMUSCULAR | Status: AC | PRN
  Administered 2021-06-19: 14 mL via INTRA_ARTERIAL

## 2021-06-19 MED ORDER — ESMOLOL HCL 100 MG/10ML IV SOLN
INTRAVENOUS | Status: DC | PRN
  Administered 2021-06-19: 20 mg via INTRAVENOUS

## 2021-06-19 MED ORDER — ASPIRIN 325 MG PO TABS
325.0000 mg | ORAL_TABLET | Freq: Once | ORAL | Status: AC
  Administered 2021-06-19: 325 mg
  Filled 2021-06-19: qty 1

## 2021-06-19 MED ORDER — LIDOCAINE HCL (CARDIAC) PF 100 MG/5ML IV SOSY
PREFILLED_SYRINGE | INTRAVENOUS | Status: AC
  Filled 2021-06-19: qty 5

## 2021-06-19 MED ORDER — FENTANYL 2500MCG IN NS 250ML (10MCG/ML) PREMIX INFUSION
25.0000 ug/h | INTRAVENOUS | Status: DC
  Filled 2021-06-19: qty 250

## 2021-06-19 MED ORDER — POLYETHYLENE GLYCOL 3350 17 G PO PACK
17.0000 g | PACK | Freq: Every day | ORAL | Status: DC
  Administered 2021-06-19 – 2021-06-23 (×5): 17 g
  Filled 2021-06-19 (×6): qty 1

## 2021-06-19 MED ORDER — ROCURONIUM BROMIDE 50 MG/5ML IV SOLN
INTRAVENOUS | Status: AC | PRN
  Administered 2021-06-19: 10 mg via INTRAVENOUS

## 2021-06-19 MED ORDER — ETOMIDATE 2 MG/ML IV SOLN
INTRAVENOUS | Status: AC | PRN
  Administered 2021-06-19: 20 mg via INTRAVENOUS

## 2021-06-19 MED ORDER — POTASSIUM CHLORIDE 10 MEQ/100ML IV SOLN
10.0000 meq | INTRAVENOUS | Status: AC
  Administered 2021-06-19 (×2): 10 meq via INTRAVENOUS
  Filled 2021-06-19 (×2): qty 100

## 2021-06-19 MED ORDER — BUPROPION HCL 100 MG PO TABS
50.0000 mg | ORAL_TABLET | Freq: Three times a day (TID) | ORAL | Status: DC
Start: 2021-06-20 — End: 2021-06-28
  Administered 2021-06-20 – 2021-06-27 (×23): 50 mg
  Filled 2021-06-19 (×25): qty 0.5

## 2021-06-19 MED ORDER — SODIUM CHLORIDE 0.9% FLUSH: 3.0000 mL | Freq: Once | INTRAVENOUS | Status: DC

## 2021-06-19 MED ORDER — CHLORHEXIDINE GLUCONATE CLOTH 2 % EX PADS
6.0000 | MEDICATED_PAD | Freq: Every day | CUTANEOUS | Status: DC
  Administered 2021-06-20 – 2021-06-29 (×9): 6 via TOPICAL

## 2021-06-19 MED ORDER — ASPIRIN 81 MG PO CHEW: 81.0000 mg | CHEWABLE_TABLET | Freq: Every day | ORAL | Status: DC

## 2021-06-19 MED ORDER — BUPROPION HCL ER (XL) 150 MG PO TB24: 150.0000 mg | ORAL_TABLET | Freq: Every day | ORAL | Status: DC

## 2021-06-19 MED ORDER — PROSOURCE TF PO LIQD
45.0000 mL | Freq: Two times a day (BID) | ORAL | Status: DC
  Administered 2021-06-19 – 2021-06-21 (×5): 45 mL
  Filled 2021-06-19 (×5): qty 45

## 2021-06-19 MED ORDER — ONDANSETRON HCL 4 MG/2ML IJ SOLN
INTRAMUSCULAR | Status: DC | PRN
  Administered 2021-06-19: 4 mg via INTRAVENOUS

## 2021-06-19 MED ORDER — ROCURONIUM BROMIDE 50 MG/5ML IV SOLN
INTRAVENOUS | Status: AC
  Filled 2021-06-19: qty 2

## 2021-06-19 MED ORDER — CALCIUM GLUCONATE-NACL 1-0.675 GM/50ML-% IV SOLN
1.0000 g | Freq: Once | INTRAVENOUS | Status: AC
  Administered 2021-06-19: 1000 mg via INTRAVENOUS
  Filled 2021-06-19: qty 50

## 2021-06-19 MED ORDER — POTASSIUM CHLORIDE 10 MEQ/100ML IV SOLN: 10.0000 meq | INTRAVENOUS | Status: AC

## 2021-06-19 MED ORDER — VITAL HIGH PROTEIN PO LIQD
1000.0000 mL | ORAL | Status: DC
  Administered 2021-06-20: 15:00:00 1000 mL

## 2021-06-19 MED ORDER — SUCCINYLCHOLINE CHLORIDE 20 MG/ML IJ SOLN
INTRAMUSCULAR | Status: AC
  Filled 2021-06-19: qty 1

## 2021-06-19 MED ORDER — ROCURONIUM BROMIDE 10 MG/ML (PF) SYRINGE
PREFILLED_SYRINGE | INTRAVENOUS | Status: DC | PRN
  Administered 2021-06-19: 50 mg via INTRAVENOUS

## 2021-06-19 MED ORDER — IOHEXOL 300 MG/ML  SOLN
100.0000 mL | Freq: Once | INTRAMUSCULAR | Status: AC | PRN
  Administered 2021-06-19: 50 mL via INTRA_ARTERIAL

## 2021-06-19 MED ORDER — ETOMIDATE 2 MG/ML IV SOLN
INTRAVENOUS | Status: AC
  Filled 2021-06-19: qty 20

## 2021-06-19 MED ORDER — INSULIN ASPART 100 UNIT/ML IJ SOLN
0.0000 [IU] | Freq: Three times a day (TID) | INTRAMUSCULAR | Status: DC
  Administered 2021-06-19: 2 [IU] via SUBCUTANEOUS
  Administered 2021-06-19: 5 [IU] via SUBCUTANEOUS
  Administered 2021-06-20 (×3): 1 [IU] via SUBCUTANEOUS

## 2021-06-19 MED ORDER — TICAGRELOR 90 MG PO TABS
90.0000 mg | ORAL_TABLET | Freq: Two times a day (BID) | ORAL | Status: DC
  Administered 2021-06-20 – 2021-06-29 (×19): 90 mg
  Filled 2021-06-19 (×19): qty 1

## 2021-06-19 MED ORDER — FENTANYL 2500MCG IN NS 250ML (10MCG/ML) PREMIX INFUSION
50.0000 ug/h | INTRAVENOUS | Status: DC
  Administered 2021-06-19 – 2021-06-20 (×2): 100 ug/h via INTRAVENOUS
  Administered 2021-06-21: 25 ug/h via INTRAVENOUS
  Administered 2021-06-22: 100 ug/h via INTRAVENOUS
  Administered 2021-06-23: 50 ug/h via INTRAVENOUS
  Administered 2021-06-24: 100 ug/h via INTRAVENOUS
  Administered 2021-06-25: 75 ug/h via INTRAVENOUS
  Filled 2021-06-19 (×6): qty 250

## 2021-06-19 MED ORDER — TICAGRELOR 90 MG PO TABS
180.0000 mg | ORAL_TABLET | Freq: Once | ORAL | Status: AC
  Administered 2021-06-19: 180 mg
  Filled 2021-06-19: qty 2

## 2021-06-19 MED ORDER — DOCUSATE SODIUM 50 MG/5ML PO LIQD
100.0000 mg | Freq: Two times a day (BID) | ORAL | Status: DC
  Administered 2021-06-19 – 2021-06-27 (×11): 100 mg
  Filled 2021-06-19 (×13): qty 10

## 2021-06-19 MED ORDER — CLEVIDIPINE BUTYRATE 0.5 MG/ML IV EMUL
INTRAVENOUS | Status: AC
  Administered 2021-06-19: 1 mg/h via INTRAVENOUS
  Filled 2021-06-19: qty 50

## 2021-06-19 MED ORDER — PANTOPRAZOLE SODIUM 40 MG IV SOLR
40.0000 mg | Freq: Every day | INTRAVENOUS | Status: DC
  Administered 2021-06-19 – 2021-06-21 (×3): 40 mg via INTRAVENOUS
  Filled 2021-06-19 (×3): qty 40

## 2021-06-19 MED ORDER — IOHEXOL 300 MG/ML  SOLN
100.0000 mL | Freq: Once | INTRAMUSCULAR | Status: AC | PRN
  Administered 2021-06-19: 15 mL via INTRA_ARTERIAL

## 2021-06-19 MED ORDER — ORAL CARE MOUTH RINSE
15.0000 mL | OROMUCOSAL | Status: DC
  Administered 2021-06-19 – 2021-06-29 (×101): 15 mL via OROMUCOSAL

## 2021-06-19 MED ORDER — FLUOXETINE HCL 20 MG PO CAPS
20.0000 mg | ORAL_CAPSULE | Freq: Every day | ORAL | Status: DC
  Administered 2021-06-20 – 2021-06-27 (×8): 20 mg
  Filled 2021-06-19 (×8): qty 1

## 2021-06-19 MED ORDER — FENTANYL BOLUS VIA INFUSION
50.0000 ug | INTRAVENOUS | Status: DC | PRN
  Administered 2021-06-21: 50 ug via INTRAVENOUS
  Filled 2021-06-19: qty 100

## 2021-06-19 NOTE — Anesthesia Preprocedure Evaluation (Signed)
Anesthesia Evaluation  Patient identified by MRN, date of birth, ID band Patient unresponsive    Reviewed: Unable to perform ROS - Chart review only  Airway Mallampati: Intubated       Dental   Pulmonary    breath sounds clear to auscultation       Cardiovascular  Rhythm:Regular Rate:Tachycardia     Neuro/Psych SAH    GI/Hepatic   Endo/Other    Renal/GU      Musculoskeletal   Abdominal   Peds  Hematology   Anesthesia Other Findings   Reproductive/Obstetrics                             Anesthesia Physical Anesthesia Plan  ASA: 3 and emergent  Anesthesia Plan: General   Post-op Pain Management:    Induction: Intravenous  PONV Risk Score and Plan: 3 and Treatment may vary due to age or medical condition  Airway Management Planned: Oral ETT  Additional Equipment: Arterial line, CVP and Ultrasound Guidance Line Placement  Intra-op Plan:   Post-operative Plan: Post-operative intubation/ventilation  Informed Consent:     Only emergency history available  Plan Discussed with: CRNA and Anesthesiologist  Anesthesia Plan Comments:         Anesthesia Quick Evaluation

## 2021-06-19 NOTE — ED Notes (Signed)
Neurosurgeon at bedside , explained plan of care to patient's family .

## 2021-06-19 NOTE — ED Notes (Signed)
Critical result given to RN Wendy Poet

## 2021-06-19 NOTE — Code Documentation (Signed)
Stroke Response Nurse Documentation Code Documentation  Kelly Gillespie is a 35 y.o. female arriving to [facility] ED via [facility] on 12/3 with past medical hx of seizures and migraines. On No antithrombotic. Code stroke was activated by EMS.   Patient from home where she was LKW at [facility] approximately and now complaining of headache and altered mental status.   Stroke team at the bedside on patient arrival. Labs drawn and patient cleared for CT by Dr. Eudelia Bunch. Patient to CT with team. NIHSS 13, see documentation for details and code stroke times. Patient with decreased LOC, disoriented, not following commands, left arm weakness, bilateral leg weakness, Global aphasia , and dysarthria  on exam. The following imaging was completed:  CT, CTA head and neck (after intubation). Patient is not a candidate for IV Thrombolytic due to [facility].   Care/Plan: Intubation, BP control, NS consult.   Bedside handoff with ED RN Aundra Millet.    Rose Fillers  Rapid Response RN

## 2021-06-19 NOTE — Sedation Documentation (Signed)
Pt transported to 4N16 via bed accompanied by primary RN, IR RN, CRNA and IR Tech. Reconnected to unit monitor. VSS. No s/s of distress at this time. Bilateral lower distal pulses present and palpable.

## 2021-06-19 NOTE — Consult Note (Addendum)
Neurology Consultation  Reason for Consult: Code stroke, headache, unresponsiveness   Referred by: Dr. Eudelia Bunch CC: Code stroke-headache, unresponsiveness  History is obtained from: Chart, patient's mother, EMS  HPI: Kelly Gillespie is a 35 y.o. female past medical history of a history of migraines, seizure a few months ago, depression, presented to the emergency room via EMS was called to evaluate the patient for depressed level of consciousness and altered mental status. Last known well was somewhere around noon of 06/18/2021-witnessed normal by mother at lunchtime. Has had a headache that is worse than any migraine ever. She had had multiple episodes of vomiting.  The boyfriend returned back from a concert and saw her to be moaning in bed, not responding to voice appropriately. EMS was called, initial examination with some left-sided weakness, depressed level of consciousness and asymmetric pupils noted by EMS.  The pupillary asymmetry resolved on the way and she was able to move all her extremities although left side seemed weaker.  Systolic blood pressure in the high 220s. Given new character of headache, history of migraines, altered mental status, vomiting and hypertension-code stroke was activated by [facility] after radio exchange with the emergency room. Patient unable to provide any history  LKW: 12 PM 06/18/2021 tpa given?: no, subarachnoid hemorrhage Premorbid modified Rankin scale (mRS): 0 Subarachnoid hemorrhage-Hunt Hess-4, Fisher grade 4  ROS: Unable to obtain due to altered mental status.   Past Medical History:  Diagnosis Date   Abnormal Pap smear of cervix    Depression    Seizures (HCC) 07/2008   Family History  Problem Relation Age of Onset   Diabetes Maternal Grandfather    Diabetes Paternal Grandmother    Dementia Paternal Grandmother    Cancer Paternal Grandfather        prostate   Hypertension Mother      Social History:   reports that she has never  smoked. She has never used smokeless tobacco. She reports current alcohol use of about 2.0 standard drinks per week. She reports that she does not use drugs.  Medications  Current Facility-Administered Medications:    clevidipine (CLEVIPREX) infusion 0.5 mg/mL, 0-21 mg/hr, Intravenous, Continuous, Cardama, Amadeo Garnet, MD, Last Rate: 2 mL/hr at 06/22/21 0224, 1 mg/hr at 06/22/2021 0224   etomidate (AMIDATE) 2 MG/ML injection, , , ,    etomidate (AMIDATE) injection, , Intravenous, PRN, Cardama, Amadeo Garnet, MD, 20 mg at 2021/06/22 1062   fentaNYL (SUBLIMAZE) bolus via infusion 50 mcg, 50 mcg, Intravenous, Q1H PRN, Cardama, Amadeo Garnet, MD   fentaNYL in NS (11mcg/ml) infusion-PREMIX, 25-400 mcg/hr, Intravenous, Continuous, Cardama, Amadeo Garnet, MD   lidocaine (cardiac) 100 mg/53mL (XYLOCAINE) 100 MG/5ML injection 2%, , , ,    propofol (DIPRIVAN) 1000 MG/100ML infusion, 0-50 mcg/kg/min, Intravenous, Continuous, Cardama, Amadeo Garnet, MD   rocuronium [facility]) 50 MG/5ML injection, , , ,    rocuronium (ZEMURON) injection, , Intravenous, PRN, Cardama, Amadeo Garnet, MD, 10 mg at 06/22/2021 6948   sodium chloride flush (NS) 0.9 % injection 3 mL, 3 mL, Intravenous, Once, Cardama, Amadeo Garnet, MD   succinylcholine (ANECTINE) 20 MG/ML injection, , , ,   Current Outpatient Medications:    amphetamine-dextroamphetamine (ADDERALL XR) 30 MG 24 hr capsule, Take 30 mg by mouth 2 (two) times daily., Disp: , Rfl:    buPROPion (WELLBUTRIN XL) 150 MG 24 hr tablet, Take 1 tablet by mouth daily., Disp: , Rfl: 12   FLUoxetine (PROZAC) 20 MG capsule, TK 1 C PO QD, Disp: ,  Rfl: 4   norethindrone (MICRONOR) 0.35 MG tablet, Take 1 tablet by mouth daily., Disp: 84 tablet, Rfl: 0   Exam: Current vital signs: BP (!) 185/99   Pulse 73   Temp (!) 96.9 F (36.1 C) (Temporal)   Ht 5\' 2"  (1.575 m)   Wt 54.2 kg   SpO2 100%   BMI 21.85 kg/m  Vital signs in last 24 hours: Temp:  [96.9 F (36.1 C)]  96.9 F (36.1 C) (12/03 0220) Pulse Rate:  [73] 73 (12/03 0145) BP: (185)/(99) 185/99 (12/03 0145) SpO2:  [100 %] 100 % (12/03 0213) FiO2 (%):  [40 %] 40 % (12/03 0213) Weight:  [54.2 kg] 54.2 kg (12/03 0152)  General: Stuporous, barely opens eyes to noxious stimulation HEENT: Normocephalic/atraumatic CVS: Regular rhythm Respiratory: Normal saturation, maintaining her airway for now, diminished breath sound at bases Abdomen nondistended nontender Neurological exam Stuporous Barely opens eyes to noxious ambulation Does not follow commands Mumbles words that do not make sense together Moderate dysarthria Cranial nerves: Pupils are 4 mm sluggishly reactive bilaterally, no gaze preference or deviation, does not blink to threat from either side, face appears symmetric. Motor examination: Antigravity in right upper extremity without drift, left upper extremity is weaker with vertical drift.  Both lower extremities drift and 5 seconds with 4/5 strength. Sensory exam: Much stronger withdrawal to right upper right lower extremity in comparison to the left upper and left lower extremity. Coordination cannot be assessed given her mentation NIH stroke scale-13 GCS-E2, V3, M4 -- total: 9  Labs I have reviewed labs in epic and the results pertinent to this consultation are:  CBC    Component Value Date/Time   WBC 10.8 08/21/2018 1559   WBC 11.4 (A) 03/06/2018 1301   RBC 4.99 08/21/2018 1559   RBC 5.15 03/06/2018 1301   HGB 13.4 08/21/2018 1559   HCT 40.4 08/21/2018 1559   PLT 391 08/21/2018 1559   MCV 81 08/21/2018 1559   MCH 26.9 08/21/2018 1559   MCH 27.0 03/06/2018 1301   MCHC 33.2 08/21/2018 1559   MCHC 34.1 03/06/2018 1301   RDW 13.3 08/21/2018 1559    CMP     Component Value Date/Time   NA 137 08/21/2018 1559   K 3.6 08/21/2018 1559   CL 100 08/21/2018 1559   CO2 18 (L) 08/21/2018 1559   GLUCOSE 128 (H) 08/21/2018 1559   BUN 11 08/21/2018 1559   CREATININE 0.76  08/21/2018 1559   CALCIUM 9.0 08/21/2018 1559   PROT 6.8 08/21/2018 1559   ALBUMIN 4.1 08/21/2018 1559   AST 14 08/21/2018 1559   ALT 21 08/21/2018 1559   ALKPHOS 122 (H) 08/21/2018 1559   BILITOT 0.3 08/21/2018 1559   GFRNONAA 103 08/21/2018 1559   GFRAA 119 08/21/2018 1559   Labs pending  Imaging I have reviewed the images obtained:  CT-head-large subarachnoid hemorrhage, and the basal cisterns consistent with aneurysmal hemorrhage.  Also blood in the fourth ventricle and obstructive hydrocephalus.  Assessment:  35 year old with above past medical history-brought in for evaluation of headache (possibly worst headache of her life-much different than usual migraine + vomiting), depressed level of consciousness and inability to follow commands along with being severely hypertensive on scene and having had multiple episodes of vomiting over the past 12 hours or so. Last known well-noon time 06/18/2021. CT head consistent with aneurysmal looking pattern of large-volume subarachnoid hemorrhage-Hunt Hess 4, Fisher grade 4.  Impression: Subarachnoid hemorrhage-HH 4, Fisher 4  Recommendations: Emergent neurosurgical consultation  CTA head and neck after intubating-was very agitated and unable to stay still for a good-quality CT angiogram. Further management per neurosurgery and CTA results. Blood pressure goal less than 140 Tox screen Labs - CBC, BMP, coags Plan discussed with Dr. Eudelia Bunch in the ER. Clinical and imaging findings as well as plan going forward were discussed with the patient's mother Ms. Kelly Gillespie at bedside.   Inpatient neurology service will be available as needed.  Please call with questions. -- Milon Dikes, MD Neurologist [facility] Pager: [phone]   CRITICAL CARE ATTESTATION Performed by: Milon Dikes, MD Total critical care time: 45 minutes Critical care time was exclusive of separately billable procedures and treating other patients and/or  supervising APPs/Residents/Students Critical care was necessary to treat or prevent imminent or life-threatening deterioration due to [facility]  This patient is critically ill and at significant risk for neurological worsening and/or death and care requires constant monitoring. Critical care was time spent personally by me on the following activities: development of treatment plan with patient and/or surrogate as well as nursing, discussions with consultants, evaluation of patient's response to treatment, examination of patient, obtaining history from patient or surrogate, ordering and performing treatments and interventions, ordering and review of laboratory studies, ordering and review of radiographic studies, pulse oximetry, re-evaluation of patient's condition, participation in multidisciplinary rounds and medical decision making of high complexity in the care of this patient.

## 2021-06-19 NOTE — Consult Note (Signed)
NAME:  Kelly Gillespie, MRN:  [id number], DOB:  1986-06-24, LOS: 0 ADMISSION DATE:  07/06/2021, CONSULTATION DATE:  07/02/2021 REFERRING MD:  Dr. Conchita Paris , CHIEF COMPLAINT:  Rupture cerebral aneurysm    History of Present Illness:  CAM HARNDEN is a 35 year old female with DM2, hypothyroidism, hx seizures, depression and migraines  who presented to the ED via EMS for AMS and decreased LOC. LKNwas around noon 12/2. Prior to presentation she had headache, nausea and vomiting. On EMS arrival patient was seen with left sided weakness, decreased LOC, and asymmetric pupils. SBP high 220's as well.   Code stoke call en route to ED. Head CT on arrival consistent with aneurysmal bleed with large volume SAH. 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 Prior to [facility] patient was intubated for airway protection and severe combativeness   Pertinent  Medical History  Seizures Migraines  Depression   Significant Hospital Events: Including procedures, antibiotic start and stop dates in addition to other pertinent events   12/3 Admitted after being found with AMS and decreased LOC. Code stroke called, head CT positive for [facility]  Interim History / Subjective:  As above   Objective   Blood pressure 111/80, pulse (!) 101, temperature (!) 97.1 F (36.2 C), temperature source Axillary, resp. rate 14, height 5\' 2"  (1.575 m), weight 54.2 kg, SpO2 100 %.    Vent Mode: PRVC FiO2 (%):  [40 %] 40 % Set Rate:  [14 bmp-16 bmp] 14 bmp Vt Set:  [400 mL] 400 mL PEEP:  [5 cmH20] 5 cmH20 Plateau Pressure:  [11 cmH20-15 cmH20] 15 cmH20   Intake/Output Summary (Last 24 hours) at 06/26/2021 1303 Last data filed at 07/06/2021 1245 Gross per 24 hour  Intake 1496.21 ml  Output 1171 ml  Net 325.21 ml   Filed Weights   07/02/2021 0152  Weight: 54.2 kg    Examination: General: Young female  on mechanical ventilation, sedated HEENT: ETT, MM pink/moist, PERRL,  EVD in place Neuro: PERRL, + gag/cough, withdraws upper and lower extremities to noxious stimuli, non-purposeful movements off sedation  CV: s1s2 regular rate and rhythm, no murmur, rubs, or gallops,  PULM:  CTAB, no wheezing, rhonchi or rales GI: soft, bowel sounds active in all 4 quadrants, non-tender, non-distended Extremities: warm/dry, edema  Skin: no rashes or lesions  CT head 06/22/2021 - Large volume SAH, early hydrocephalus, no midline shift  Resolved Hospital Problem list     Assessment & Plan:  Acute subarachnoid hemorrhage  -Code stroke head CT with large volume subarachnoid hemorrhage, concentrated in the basal cisterns. Pattern is consistent with an aneurysmal hemorrhage and likely early hydrocephalus. -Diagnostic angiogram wit right P1 aneurysm not amenable to coil embolization Hx of migraines  P: NSG planning for surgical clip ligation vs flow diverting stent EVD/Anticoagulation/antiplatelets and imaging per NSG Cleviprex gtt for SBP goal less than 140 Maintain neuro protective measures; goal for eurothermia, euglycemia, eunatermia, normoxia, and PCO2 goal of 35-40 Nutrition and bowel regiment  Seizure precautions  AEDs per neurology  Aspirations precautions   Acute respiratory insufficiency   -In the setting of SAH P: Full vent support LTVV, 4-8cc/kg IBW with goal Pplat<30 and DP<15 Wean PEEP and FiO2 for sats greater than 90%. Head of bed elevated 30 degrees. Plateau pressures less than 30 cm H20.  Follow intermittent chest x-ray and ABG.   Hold on SBT/WUA  until post-op Ensure adequate pulmonary hygiene  Follow cultures  VAP bundle in place  PAD protocol  Hypertensive emergency  -BP 185/99 on admit  P: Cleviprex drip for bp control  Continuous telemetry   Hypokalemia  -K 2.6 on admit Hypocalcemia  P: Replete K and Ca  Leukocytosis  -Likely reactive secondary to SAH P: Trend  CBC and fever curve  No acute indication for antibiotic therapy   DM2 Hyperglycemia  -A1C 6.4 meeting criteria for diabetes diagnosis  P: SSI  CBG q4 CBG goal 140-180  ADD/Depression -Hold on adderrall for HTN -Wellbutrin, fluoxetine  Hypothyroidism -Synthroid  Best Practice (right click and "Reselect all SmartList Selections" daily)   Diet/type: tubefeeds DVT prophylaxis: SCD GI prophylaxis: PPI Lines: Central line Foley:  Yes, and it is still needed Code Status:  full code Last date of multidisciplinary goals of care discussion: Pending  Labs   CBC: Recent Labs  Lab 07/10/2021 0221 06/23/2021 0239 07/08/2021 0315  WBC 16.2*  --   --   NEUTROABS 13.2*  --   --   HGB 13.9 13.3 14.3  HCT 41.1 39.0 42.0  MCV 83.2  --   --   PLT 346  --   --     Basic Metabolic Panel: Recent Labs  Lab 06/24/2021 0221 06/25/2021 0239 07/13/2021 0315  NA 137 141 138  K 2.6* 2.4* 2.7*  CL 101 105  --   CO2 24  --   --   GLUCOSE 287* 260*  --   BUN 9 9  --   CREATININE 0.92 0.50  --   CALCIUM 8.7*  --   --    GFR: Estimated Creatinine Clearance: 77.6 mL/min (by C-G formula based on SCr of 0.5 mg/dL). Recent Labs  Lab 06/21/2021 0221  WBC 16.2*    Liver Function Tests: Recent Labs  Lab 07/06/2021 0221  AST 22  ALT 12  ALKPHOS 82  BILITOT 0.2*  PROT 6.8  ALBUMIN 3.8   No results for input(s): LIPASE, AMYLASE in the last 168 hours. No results for input(s): AMMONIA in the last 168 hours.  ABG    Component Value Date/Time   PHART 7.436 07/16/2021 0315   PCO2ART 35.5 07/17/2021 0315   PO2ART 211 (H) 06/26/2021 0315   HCO3 23.9 06/22/2021 0315   TCO2 25 07/05/2021 0315   O2SAT 100.0 06/25/2021 0315     Coagulation Profile: Recent Labs  Lab 06/21/2021 0221  INR 1.1    Cardiac Enzymes: No results for input(s): CKTOTAL, CKMB, CKMBINDEX, TROPONINI in the last 168 hours.  HbA1C: Hgb A1c MFr Bld  Date/Time Value Ref Range Status  07/12/2021 06:53 AM 6.4 (H) 4.8 - 5.6  % Final    Comment:    (NOTE) Pre diabetes:          5.7%-6.4%  Diabetes:              >6.4%  Glycemic control for   <7.0% adults with diabetes   08/21/2018 03:59 PM 7.3 (H) 4.8 - 5.6 % Final    Comment:             Prediabetes: 5.7 - 6.4          Diabetes: >6.4          Glycemic control for adults with diabetes: <7.0     CBG: Recent Labs  Lab 06/27/2021 0145 06/26/2021 0739 07/03/2021 1122  GLUCAP 262* 284* 167*    Review of Systems:   Unable to  assess  Past Medical History:  She,  has a past medical history of Abnormal Pap smear of cervix, Depression, and Seizures (HCC) (07/2008).   Surgical History:   Past Surgical History:  Procedure Laterality Date   COLPOSCOPY  09/13/12   ASCUS with + HR HPV - colpo CIN I   IR 3D INDEPENDENT WKST  07/02/2021   IR ANGIO INTRA EXTRACRAN SEL INTERNAL CAROTID BILAT MOD SED  07/04/2021   IR ANGIO VERTEBRAL SEL VERTEBRAL BILAT MOD SED  07/02/2021   NO PAST SURGERIES       Social History:   reports that she has never smoked. She has never used smokeless tobacco. She reports current alcohol use of about 2.0 standard drinks per week. She reports that she does not use drugs.   Family History:  Her family history includes Cancer in her paternal grandfather; Dementia in her paternal grandmother; Diabetes in her maternal grandfather and paternal grandmother; Hypertension in her mother.   Allergies No Known Allergies   Home Medications  Prior to Admission medications   Medication Sig Start Date End Date Taking? Authorizing Provider  amphetamine-dextroamphetamine (ADDERALL XR) 30 MG 24 hr capsule Take 60 mg by mouth daily.   Yes [provider]  amphetamine-dextroamphetamine (ADDERALL) 20 MG tablet Take 20 mg by mouth 3 (three) times daily. 05/24/21  Yes [provider]  buPROPion (WELLBUTRIN XL) 150 MG 24 hr tablet Take 150 mg by mouth daily. 07/21/14  Yes [provider]  FLUoxetine (PROZAC) 20 MG capsule Take 20 mg  by mouth daily. 02/27/18  Yes [provider]  levothyroxine (SYNTHROID) 75 MCG tablet Take 75 mcg by mouth every morning. 04/16/21  Yes [provider]  metFORMIN (GLUCOPHAGE-XR) 500 MG 24 hr tablet Take 500 mg by mouth daily. 04/21/21  Yes [provider]  SEMGLEE, YFGN, 100 UNIT/ML Pen Inject 6-8 Units into the skin daily. 04/14/21  Yes [provider]  VIENVA 0.1-20 MG-MCG tablet Take 1 tablet by mouth daily. 04/14/21  Yes [provider]  zolmitriptan (ZOMIG) 5 MG tablet Take 5 mg by mouth See admin instructions. 5mg  oral at migraine onset, may repeat after 2 hours. Max 10mg  by mouth in 24 hours 05/25/21  Yes [provider]  norethindrone (MICRONOR) 0.35 MG tablet Take 1 tablet by mouth daily. Patient not taking: Reported on 07/13/2021 05/31/19   14/09/2020, CNM  OZEMPIC, 1 MG/DOSE, 4 MG/3ML SOPN Inject 1 mg into the skin once a week. 04/14/21   [provider]  pioglitazone (ACTOS) 15 MG tablet Take 15 mg by mouth daily. 06/18/21   [provider]  Vilazodone HCl 20 MG TABS Take 1 tablet by mouth daily as needed for depression. Patient not taking: Reported on 07/04/2021 06/18/21   [provider]     Critical care time:    The patient is critically ill with multiple organ systems failure and requires high complexity decision making for assessment and support, frequent evaluation and titration of therapies, application of advanced monitoring technologies and extensive interpretation of multiple databases.  Independent Critical Care Time: 50 Minutes.   14/09/2020, M.D. [facility] Pulmonary/Critical Care Medicine 07/08/2021 3:33 PM   Please see Amion for pager number to reach on-call Pulmonary and Critical Care Team.

## 2021-06-19 NOTE — Procedures (Signed)
PROCEDURE: Right frontal ventriculostomy   SURGEON: Dr. Lisbeth Renshaw, MD  ANESTHESIA: IV Sedation (propofol and fentanyl) with Local (lidocaine with epi)  EBL: Minimal  SPECIMENS: None  COMPLICATIONS: None  CONDITION: Hemodynamically stable  INDICATIONS: Mrs. Kelly Gillespie is a 35 y.o. female presenting to the emergency department obtunded with CT scan demonstrating diffuse subarachnoid hemorrhage and significant ventriculomegaly consistent with hydrocephalus.  Placement of external ventricular drain was therefore indicated.  The risks, benefits, and alternatives to the procedure were reviewed in detail with the patient's family.  After all their questions were answered, her mother provided informed consent on her behalf.  PROCEDURE IN DETAIL: After consent was obtained from the patient's family, skin of the right frontal scalp was clipped, prepped and draped in the usual sterile fashion.  Scalp was then infiltrated with local anesthetic with epinephrine.  Skin incision was made sharply, and twist drill burr hole was made.  The dura was then punctured, and the ventricular catheter was passed in a single attempt into the right lateral ventricle to a depth of approximately 7cm at the skin.  Good CSF flow was obtained.  The catheter was then tunneled subcutaneously and connected to a drainage system and the skin incision closed.  The drain was then secured in place.  FINDINGS: 1. Opening pressure ~20cmH2O 2. Slightly blood-tinged CSF   Lisbeth Renshaw, MD [facility]

## 2021-06-19 NOTE — Transfer of Care (Signed)
Immediate Anesthesia Transfer of Care Note  Patient: Kelly Gillespie  Procedure(s) Performed: IR WITH ANESTHESIA  Patient Location: NICU  Anesthesia Type:General  Level of Consciousness: Patient remains intubated per anesthesia plan  Airway & Oxygen Therapy: Patient remains intubated per anesthesia plan  Post-op Assessment: Report given to RN and Post -op Vital signs reviewed and stable  Post vital signs: Reviewed and stable  Last Vitals:  Vitals Value Taken Time  BP    Temp    Pulse 95 07/09/2021 1037  Resp 14 07/03/2021 1049  SpO2 98 % 07/10/2021 1037  Vitals shown include unvalidated device data.  Last Pain:  Vitals:   07/16/2021 0800  TempSrc: Axillary         Complications: No notable events documented.

## 2021-06-19 NOTE — Progress Notes (Addendum)
I have reviewed the findings of the angiogram with the patient's family at bedside.  Given the morphology of the aneurysm I told him that primary coil embolization would not be feasible.  Remaining options would be surgical clip ligation versus attempt at flow diversion.  Given the location of this aneurysm, I told her that I feel that surgical approach would be somewhat difficult and I therefore recommended loading her with dual antiplatelet therapy after a follow-up CT scan confirmed stability of her hemorrhage.  We can then proceed tomorrow morning with placement of a flow diverting stent.  I did review with the patient's family the details of the procedure.  I reviewed with them the risks of the procedure to include risk of stroke totaling approximately 5 to 6%, risk of arterial dissection, contrast nephropathy, and groin hematoma.  I did also review the risk associated with starting antiplatelet medications with an unsecured aneurysm and a fresh EVD.  Nonetheless, overall I think this course of action carries a lower risk than attempt at surgical clipping of a proximal PCA aneurysm.  All their questions were answered.  Her mother provided verbal consent to proceed with this plan.   Lisbeth Renshaw, MD [facility]

## 2021-06-19 NOTE — Anesthesia Postprocedure Evaluation (Signed)
Anesthesia Post Note  Patient: Kelly Gillespie  Procedure(s) Performed: IR WITH ANESTHESIA     Patient location during evaluation: SICU Anesthesia Type: General Level of consciousness: sedated Pain management: pain level controlled Vital Signs Assessment: post-procedure vital signs reviewed and stable Respiratory status: patient remains intubated per anesthesia plan Cardiovascular status: stable Postop Assessment: no apparent nausea or vomiting Anesthetic complications: no   No notable events documented.  Last Vitals:  Vitals:   06/23/2021 1400 06/26/2021 1415  BP: 109/80 110/76  Pulse: 100 100  Resp: 14 14  Temp:    SpO2: 100% 100%    Last Pain:  Vitals:   07/08/2021 1200  TempSrc: Axillary                 Kelly Gillespie

## 2021-06-19 NOTE — Anesthesia Procedure Notes (Signed)
Central Venous Catheter Insertion Performed by: Val Eagle, MD, anesthesiologist Start/End12/15/2022 8:46 AM, 06/19/2021 8:53 AM Patient location: OR. Preanesthetic checklist: patient identified, IV checked, site marked, risks and benefits discussed, surgical consent, monitors and equipment checked, pre-op evaluation, timeout performed and anesthesia consent Patient sedated Hand hygiene performed  and maximum sterile barriers used  Catheter size: 8 Fr Total catheter length 16. Central line was placed.Double lumen Procedure performed using ultrasound guided technique. Ultrasound Notes:anatomy identified, needle tip was noted to be adjacent to the nerve/plexus identified, no ultrasound evidence of intravascular and/or intraneural injection and image(s) printed for medical record Attempts: 1 Following insertion, dressing applied, line sutured and Biopatch. Post procedure assessment: blood return through all ports, free fluid flow and no air  Patient tolerated the procedure well with no immediate complications.

## 2021-06-19 NOTE — ED Notes (Signed)
Assumed care on patient , Cleviprex IV at 21 mg/hr , Propofol drip at 10 mcg/min , Fentanyl drip at 50 mcg/hr. intact , ETT intact .

## 2021-06-19 NOTE — ED Provider Notes (Signed)
[facility] EMERGENCY DEPARTMENT Provider Note  CSN: [id number] Arrival date & time: 07/14/2021 0143  Chief Complaint(s) No chief complaint on file.  HPI Kelly Gillespie is a 35 y.o. female who presented to the emergency department as a code stroke.  Last seen normal approximately 12 to 13 hours ago.  EMS was called out for altered mental status.  Patient would intermittently answer questions to EMS.  They were able to ascertain that patient had severe headache.  She had several episodes of emesis.  Noted likely left-sided weakness and asymmetric pupils.  Patient had elevated systolic blood pressures in the 220s.  EMS activated code stroke.  Remainder of history, ROS, and physical exam limited due to patient's condition (altered mental status). Additional information was obtained from EMS.   Level V Caveat.   HPI  Past Medical History Past Medical History:  Diagnosis Date   Abnormal Pap smear of cervix    Depression    Seizures (HCC) 07/2008   Patient Active Problem List   Diagnosis Date Noted   Injury of foot, left 10/26/2011   Home Medication(s) Prior to Admission medications   Medication Sig Start Date End Date Taking? Authorizing Provider  amphetamine-dextroamphetamine (ADDERALL XR) 30 MG 24 hr capsule Take 30 mg by mouth 2 (two) times daily.    [provider]  buPROPion (WELLBUTRIN XL) 150 MG 24 hr tablet Take 1 tablet by mouth daily. 07/21/14   [provider]  FLUoxetine (PROZAC) 20 MG capsule TK 1 C PO QD 02/27/18   [provider]  norethindrone (MICRONOR) 0.35 MG tablet Take 1 tablet by mouth daily. 05/31/19   Verner Chol, CNM                                                                                                                                    Past Surgical History Past Surgical History:  Procedure Laterality Date   COLPOSCOPY  09/13/12   ASCUS with + HR HPV - colpo CIN I   NO PAST SURGERIES     Family  History Family History  Problem Relation Age of Onset   Diabetes Maternal Grandfather    Diabetes Paternal Grandmother    Dementia Paternal Grandmother    Cancer Paternal Grandfather        prostate   Hypertension Mother     Social History Social History   Tobacco Use   Smoking status: Never   Smokeless tobacco: Never  Vaping Use   Vaping Use: Never used  Substance Use Topics   Alcohol use: Yes    Alcohol/week: 2.0 standard drinks    Types: 2 Standard drinks or equivalent per week   Drug use: No   Allergies Patient has no known allergies.  Review of Systems Review of Systems Unable to obtain due to altered mental status Physical Exam Vital Signs  I have reviewed the triage vital signs BP Marland Kitchen)  185/99   Pulse 73   Temp (!) 96.9 F (36.1 C) (Temporal)   Wt 54.2 kg   SpO2 100%   BMI 23.14 kg/m   Physical Exam Vitals reviewed.  Constitutional:      General: She is not in acute distress.    Appearance: She is well-developed. She is not diaphoretic.  HENT:     Head: Normocephalic and atraumatic.     Nose: Nose normal.  Eyes:     General: No scleral icterus.       Right eye: No discharge.        Left eye: No discharge.     Conjunctiva/sclera: Conjunctivae normal.     Pupils: Pupils are equal, round, and reactive to light.  Cardiovascular:     Rate and Rhythm: Normal rate and regular rhythm.     Heart sounds: No murmur heard.   No friction rub. No gallop.  Pulmonary:     Effort: Pulmonary effort is normal. No respiratory distress.     Breath sounds: Normal breath sounds. No stridor. No rales.  Abdominal:     General: There is no distension.     Palpations: Abdomen is soft.     Tenderness: There is no abdominal tenderness.  Musculoskeletal:        General: No tenderness.     Cervical back: Normal range of motion and neck supple.  Skin:    General: Skin is warm and dry.     Findings: No erythema or rash.  Neurological:     Mental Status: She is alert.  She is disoriented.  Stuporous. Moves all extremities with variable strength. Detailed exam by neurology  ED Results and Treatments Labs (all labs ordered are listed, but only abnormal results are displayed) Labs Reviewed  CBG MONITORING, ED - Abnormal; Notable for the following components:      Result Value   Glucose-Capillary 262 (*)    All other components within normal limits  RESP PANEL BY RT-PCR (FLU A&B, COVID) ARPGX2  PROTIME-INR  APTT  CBC  DIFFERENTIAL  COMPREHENSIVE METABOLIC PANEL  I-STAT CHEM 8, ED  CBG MONITORING, ED  I-STAT BETA HCG BLOOD, ED ([facility], [facility], [facility] ONLY)                                                                                                                         EKG  EKG Interpretation  Date/Time:    Ventricular Rate:    PR Interval:    QRS Duration:   QT Interval:    QTC Calculation:   R Axis:     Text Interpretation:         Radiology CT HEAD CODE STROKE WO CONTRAST  Result Date: 07/04/2021 CLINICAL DATA:  Code stroke.  Headache and hypertension EXAM: CT HEAD WITHOUT CONTRAST TECHNIQUE: Contiguous axial images were obtained from the base of the skull through the vertex without intravenous contrast. COMPARISON:  None. FINDINGS: Brain: Large volume subarachnoid hemorrhage, concentrated in the basal  cisterns. Small amount of intraventricular blood. Likely early hydrocephalus. No midline shift or other mass effect. Vascular: No abnormal hyperdensity of the major intracranial arteries or dural venous sinuses. No intracranial atherosclerosis. Skull: The visualized skull base, calvarium and extracranial soft tissues are normal. Sinuses/Orbits: No fluid levels or advanced mucosal thickening of the visualized paranasal sinuses. No mastoid or middle ear effusion. The orbits are normal. IMPRESSION: 1. Large volume subarachnoid hemorrhage, concentrated in the basal cisterns. 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 2. Likely early  hydrocephalus. Critical Value/emergent results were called by telephone at the time of interpretation on 06/21/2021 at 2:14 am to provider Milon Dikes, who verbally acknowledged these results. Electronically Signed   By: Deatra Robinson M.D.   On: 07/13/2021 02:15    Pertinent labs & imaging results that were available during my care of the patient were reviewed by me and considered in my medical decision making (see MDM for details).  Medications Ordered in ED Medications  sodium chloride flush (NS) 0.9 % injection 3 mL (has no administration in time range)  lidocaine (cardiac) 100 mg/77mL (XYLOCAINE) 100 MG/5ML injection 2% (has no administration in time range)  succinylcholine (ANECTINE) 20 MG/ML injection (has no administration in time range)  rocuronium (ZEMURON) 50 MG/5ML injection (has no administration in time range)  etomidate (AMIDATE) 2 MG/ML injection (has no administration in time range)  etomidate (AMIDATE) injection (20 mg Intravenous Given 06/18/2021 0212)  rocuronium [facility]) injection (10 mg Intravenous Given 06/22/2021 0212)  clevidipine (CLEVIPREX) infusion 0.5 mg/mL (has no administration in time range)  fentaNYL in NS (66mcg/ml) infusion-PREMIX (has no administration in time range)  fentaNYL (SUBLIMAZE) bolus via infusion 50 mcg (has no administration in time range)  propofol (DIPRIVAN) 1000 MG/100ML infusion (has no administration in time range)                                                                                                                                     Procedures .1-3 Lead EKG Interpretation Performed by: Nira Conn, MD Authorized by: Nira Conn, MD     Interpretation: normal     ECG rate:  98   ECG rate assessment: normal     Rhythm: sinus rhythm     Conduction: normal   Procedure Name: Intubation Date/Time: 06/22/2021 2:22 AM Performed by: Nira Conn, MD Pre-anesthesia Checklist: Patient  identified, Patient being monitored, Emergency Drugs available, Timeout performed and Suction available Oxygen Delivery Method: Nasal cannula Preoxygenation: Pre-oxygenation with 100% oxygen Induction Type: Rapid sequence Ventilation: Mask ventilation without difficulty Laryngoscope Size: Glidescope Grade View: Grade I Tube size: 7.5 mm Number of attempts: 1 Airway Equipment and Method: Rigid stylet Placement Confirmation: ETT inserted through vocal cords under direct vision, CO2 detector and Breath sounds checked- equal and bilateral Secured at: 23 cm Tube secured with: ETT holder    .Critical Care Performed by: Nira Conn, MD  Authorized by: Nira Conn, MD   Critical care provider statement:    Critical care time (minutes):  45   Critical care time was exclusive of:  Separately billable procedures and treating other patients   Critical care was necessary to treat or prevent imminent or life-threatening deterioration of the following conditions:  CNS failure or compromise   Critical care was time spent personally by me on the following activities:  Development of treatment plan with patient or surrogate, discussions with consultants, evaluation of patient's response to treatment, examination of patient, obtaining history from patient or surrogate, review of old charts, re-evaluation of patient's condition, pulse oximetry, ordering and review of radiographic studies, ordering and review of laboratory studies and ordering and performing treatments and interventions   Care discussed with: admitting provider    (including critical care time)  Medical Decision Making / ED Course I have reviewed the nursing notes for this encounter and the patient's prior records (if available in EHR or on provided paperwork).  Kelly Gillespie was evaluated in Emergency Department on 07/17/2021 for the symptoms described in the history of present illness. She was evaluated in the  context of the global COVID-19 pandemic, which necessitated consideration that the patient might be at risk for infection with the SARS-CoV-2 virus that causes COVID-19. Institutional protocols and algorithms that pertain to the evaluation of patients at risk for COVID-19 are in a state of rapid change based on information released by regulatory bodies including the CDC and federal and state organizations. These policies and algorithms were followed during the patient's care in the ED.     Code stroke.  Neurology at bedside Taken to CT scan where we noted a large subarachnoid bleed with evidence of obstruction. Patient required intubation for airway protection and to facilitate additional work-up given her intermittent agitation. Blood pressure control with Cleviprex initiated. Taken to CTA.  I noted evidence of likely aneurysm.  No active extra.  Will need to confirm with radiology.  Consulted neurosurgery who admitted patient to ICU for further management  Pertinent labs & imaging results that were available during my care of the patient were reviewed by me and considered in my medical decision making:    Final Clinical Impression(s) / ED Diagnoses Final diagnoses:  Subarachnoid bleed (HCC)     This chart was dictated using voice recognition software.  Despite best efforts to proofread,  errors can occur which can change the documentation meaning.    Nira Conn, MD 07/13/2021 [phone]

## 2021-06-19 NOTE — Brief Op Note (Addendum)
  NEUROSURGERY BRIEF OPERATIVE  NOTE   PREOP DX: Subarachnoid Hemorrhage  POSTOP DX: Same  PROCEDURE: Diagnostic cerebral angiogram  SURGEON: Dr. Lisbeth Renshaw, MD  ANESTHESIA: GETA  EBL: Minimal  SPECIMENS: None  COMPLICATIONS: None  CONDITION: Stable to recovery  FINDINGS (Full report in [facility]): 1. 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 No other aneurysms identified 3. No vasospasm   Lisbeth Renshaw, MD [facility]

## 2021-06-19 NOTE — H&P (Signed)
Chief Complaint   Chief Complaint  Patient presents with   Code Stroke    History of Present Illness  Kelly Gillespie is a 35 y.o. female presenting to the emergency department via EMS after she was found by her fianc largely unresponsive at home.  History is obtained from the patient's fianc and her mother.  She was apparently in her normal state of health until sometime this past afternoon.  She was at that point complaining of a mild headache.  Her fianc went to a concert and when he got back he found her unresponsive and called EMS.  She did apparently text him about a severe headache in the evening.  Upon arrival to the emergency department she was obtunded, appears to have been withdrawing to noxious stimuli but was intubated for airway protection.  CT demonstrated diffuse basal subarachnoid hemorrhage and she is recently undergone CT angiogram.  Of note, the patient has a history of diabetes.  No known history of hypertension.  She is a non-smoker.  No known kidney disease.  There is no known family history of intracranial aneurysms or unexplained intracranial hemorrhage.  Past Medical History   Past Medical History:  Diagnosis Date   Abnormal Pap smear of cervix    Depression    Seizures (HCC) 07/2008    Past Surgical History   Past Surgical History:  Procedure Laterality Date   COLPOSCOPY  09/13/12   ASCUS with + HR HPV - colpo CIN I   NO PAST SURGERIES      Social History   Social History   Tobacco Use   Smoking status: Never   Smokeless tobacco: Never  Vaping Use   Vaping Use: Never used  Substance Use Topics   Alcohol use: Yes    Alcohol/week: 2.0 standard drinks    Types: 2 Standard drinks or equivalent per week   Drug use: No    Medications   Prior to Admission medications   Medication Sig Start Date End Date Taking? Authorizing Provider  amphetamine-dextroamphetamine (ADDERALL XR) 30 MG 24 hr capsule Take 30 mg by mouth 2 (two) times daily.     [provider]  buPROPion (WELLBUTRIN XL) 150 MG 24 hr tablet Take 1 tablet by mouth daily. 07/21/14   [provider]  FLUoxetine (PROZAC) 20 MG capsule TK 1 C PO QD 02/27/18   [provider]  norethindrone (MICRONOR) 0.35 MG tablet Take 1 tablet by mouth daily. 05/31/19   Verner Chol, CNM    Allergies  No Known Allergies  Review of Systems  ROS  Neurologic Exam  Eyes open to pain Pupils 3mm, minimally reactive  Breathing spontaneously over vent (+) cough/(+)gag W/D LUE, localizes RUE W/D BLE  Imaging  CT scan of the head without contrast was personally reviewed and demonstrates diffuse basal subarachnoid hemorrhage including intraventricular extension primarily into the fourth ventricle.  There is ventriculomegaly including presence of the temporal horns.  CT angiogram was also personally reviewed and appears to demonstrate a small inferiorly projecting aneurysm of the proximal right P1.  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  I have discussed with  them the imaging findings including the subarachnoid hemorrhage, hydrocephalus, and presence of the aneurysm.  I have reviewed with them the need for ventriculostomy to treat the hydrocephalus as well as the need for angiogram with possible aneurysm coiling for treatment of her aneurysm.  I have reviewed with them the risks of these procedures.  Specifically with ventriculostomy we discussed the risk of infection and the unlikely risk of intracranial hemorrhage.  In regards to the angiogram and possible aneurysm coiling, I did review with him the possible risk of aneurysm rerupture or stroke, arterial  dissection, contrast nephropathy, and groin hematoma.  Patient's family appeared to understand our discussion.  All their questions this morning were answered.  Her mother provided informed consent on the patient's behalf to proceed with the above procedures.   Lisbeth Renshaw, MD [facility]

## 2021-06-19 NOTE — ED Triage Notes (Signed)
Pt from home as code stroke via [facility], LSN approx noon. Per EMS, pt had migraine hx & "was feeling worse today than normal migraine" per BF, was hypertensive, L sided weakness & "randomly answering inappropriately" during stroke screen.   18LAC 220 systolic NIH 13

## 2021-06-19 NOTE — Progress Notes (Signed)
RT note: RT and RN transported vent patient from ED to 4N16. Vitals signs stable through out.

## 2021-06-19 NOTE — Anesthesia Procedure Notes (Signed)
Arterial Line Insertion Start/End12/09/2020 9:03 AM  Patient location: Pre-op. Preanesthetic checklist: patient identified, IV checked, site marked, risks and benefits discussed, surgical consent, monitors and equipment checked, pre-op evaluation, timeout performed and anesthesia consent Lidocaine 1% used for infiltration Left, radial was placed Catheter size: 20 G Hand hygiene performed  and maximum sterile barriers used   Attempts: 1 Procedure performed without using ultrasound guided technique. Following insertion, dressing applied. Post procedure assessment: normal and unchanged

## 2021-06-20 ENCOUNTER — Inpatient Hospital Stay (HOSPITAL_COMMUNITY): Payer: BC Managed Care – PPO | Admitting: Certified Registered Nurse Anesthetist

## 2021-06-20 ENCOUNTER — Encounter (HOSPITAL_COMMUNITY): Admission: EM | Disposition: E | Payer: Self-pay | Source: Home / Self Care | Attending: Neurosurgery

## 2021-06-20 ENCOUNTER — Inpatient Hospital Stay (HOSPITAL_COMMUNITY): Payer: BC Managed Care – PPO

## 2021-06-20 DIAGNOSIS — J988 Other specified respiratory disorders: Secondary | ICD-10-CM

## 2021-06-20 DIAGNOSIS — I609 Nontraumatic subarachnoid hemorrhage, unspecified: Secondary | ICD-10-CM | POA: Diagnosis not present

## 2021-06-20 HISTORY — PX: IR INTRA CRAN STENT: IMG2345

## 2021-06-20 HISTORY — PX: RADIOLOGY WITH ANESTHESIA: SHX6223

## 2021-06-20 LAB — GLUCOSE, CAPILLARY
Glucose-Capillary: 132 mg/dL — ABNORMAL HIGH (ref 70–99)
Glucose-Capillary: 121 mg/dL — ABNORMAL HIGH (ref 70–99)
Glucose-Capillary: 129 mg/dL — ABNORMAL HIGH (ref 70–99)
Glucose-Capillary: 130 mg/dL — ABNORMAL HIGH (ref 70–99)
Glucose-Capillary: 108 mg/dL — ABNORMAL HIGH (ref 70–99)
Glucose-Capillary: 153 mg/dL — ABNORMAL HIGH (ref 70–99)

## 2021-06-20 LAB — PHOSPHORUS
Phosphorus: 3.1 mg/dL (ref 2.5–4.6)
Phosphorus: 2.2 mg/dL — ABNORMAL LOW (ref 2.5–4.6)

## 2021-06-20 LAB — BASIC METABOLIC PANEL WITH GFR
Anion gap: 7 (ref 5–15)
BUN: 11 mg/dL (ref 6–20)
CO2: 22 mmol/L (ref 22–32)
Calcium: 8.2 mg/dL — ABNORMAL LOW (ref 8.9–10.3)
Chloride: 107 mmol/L (ref 98–111)
Creatinine, Ser: 0.69 mg/dL (ref 0.44–1.00)
GFR, Estimated: >60 mL/min
Glucose, Bld: 143 mg/dL — ABNORMAL HIGH (ref 70–99)
Potassium: 3.9 mmol/L (ref 3.5–5.1)
Sodium: 136 mmol/L (ref 135–145)

## 2021-06-20 LAB — CBC
HCT: 32.3 % — ABNORMAL LOW (ref 36.0–46.0)
Hemoglobin: 10.8 g/dL — ABNORMAL LOW (ref 12.0–15.0)
MCH: 28.2 pg (ref 26.0–34.0)
MCHC: 33.4 g/dL (ref 30.0–36.0)
MCV: 84.3 fL (ref 80.0–100.0)
Platelets: 201 K/uL (ref 150–400)
RBC: 3.83 MIL/uL — ABNORMAL LOW (ref 3.87–5.11)
RDW: 13.1 % (ref 11.5–15.5)
WBC: 11.1 K/uL — ABNORMAL HIGH (ref 4.0–10.5)
nRBC: 0 % (ref 0.0–0.2)

## 2021-06-20 LAB — MAGNESIUM
Magnesium: 1.7 mg/dL (ref 1.7–2.4)
Magnesium: 1.4 mg/dL — ABNORMAL LOW (ref 1.7–2.4)

## 2021-06-20 LAB — TRIGLYCERIDES: Triglycerides: 71 mg/dL (ref <150)

## 2021-06-20 SURGERY — IR WITH ANESTHESIA
Anesthesia: General

## 2021-06-20 MED ORDER — INSULIN ASPART 100 UNIT/ML IJ SOLN
0.0000 [IU] | INTRAMUSCULAR | Status: DC
  Administered 2021-06-21: 2 [IU] via SUBCUTANEOUS
  Administered 2021-06-21: 5 [IU] via SUBCUTANEOUS
  Administered 2021-06-21: 2 [IU] via SUBCUTANEOUS
  Administered 2021-06-21: 3 [IU] via SUBCUTANEOUS
  Administered 2021-06-21: 2 [IU] via SUBCUTANEOUS
  Administered 2021-06-21: 7 [IU] via SUBCUTANEOUS
  Administered 2021-06-21 – 2021-06-22 (×2): 5 [IU] via SUBCUTANEOUS
  Administered 2021-06-22: 7 [IU] via SUBCUTANEOUS
  Administered 2021-06-22 (×2): 5 [IU] via SUBCUTANEOUS
  Administered 2021-06-22: 9 [IU] via SUBCUTANEOUS
  Administered 2021-06-23: 5 [IU] via SUBCUTANEOUS
  Administered 2021-06-23: 2 [IU] via SUBCUTANEOUS
  Administered 2021-06-23 (×2): 5 [IU] via SUBCUTANEOUS
  Administered 2021-06-23: 2 [IU] via SUBCUTANEOUS
  Administered 2021-06-23 – 2021-06-24 (×2): 3 [IU] via SUBCUTANEOUS
  Administered 2021-06-24: 2 [IU] via SUBCUTANEOUS
  Administered 2021-06-24: 3 [IU] via SUBCUTANEOUS

## 2021-06-20 MED ORDER — ROCURONIUM BROMIDE 10 MG/ML (PF) SYRINGE
PREFILLED_SYRINGE | INTRAVENOUS | Status: DC | PRN
  Administered 2021-06-20: 50 mg via INTRAVENOUS

## 2021-06-20 MED ORDER — IOHEXOL 300 MG/ML  SOLN
100.0000 mL | Freq: Once | INTRAMUSCULAR | Status: AC | PRN
  Administered 2021-06-20: 12:00:00 70 mL via INTRA_ARTERIAL

## 2021-06-20 MED ORDER — SODIUM CHLORIDE 0.9 % IV SOLN: INTRAVENOUS | Status: AC

## 2021-06-20 MED ORDER — STROKE: EARLY STAGES OF RECOVERY BOOK
Freq: Once | Status: AC
  Filled 2021-06-20: qty 1

## 2021-06-20 MED ORDER — SODIUM CHLORIDE 0.9 % IV SOLN: INTRAVENOUS | Status: DC | PRN

## 2021-06-20 MED ORDER — SODIUM CHLORIDE 0.9 % IV BOLUS
1000.0000 mL | Freq: Once | INTRAVENOUS | Status: AC
  Administered 2021-06-20: 09:00:00 1000 mL via INTRAVENOUS

## 2021-06-20 MED ORDER — LACTATED RINGERS IV BOLUS
500.0000 mL | Freq: Once | INTRAVENOUS | Status: AC
  Administered 2021-06-20: 02:00:00 500 mL via INTRAVENOUS

## 2021-06-20 MED ORDER — NIMODIPINE 30 MG PO CAPS: 60.0000 mg | ORAL_CAPSULE | ORAL | Status: DC

## 2021-06-20 MED ORDER — ONDANSETRON HCL 4 MG/2ML IJ SOLN
INTRAMUSCULAR | Status: DC | PRN
  Administered 2021-06-20: 4 mg via INTRAVENOUS

## 2021-06-20 MED ORDER — SODIUM CHLORIDE 0.9 % IV SOLN: INTRAVENOUS | Status: DC

## 2021-06-20 MED ORDER — NIMODIPINE 6 MG/ML PO SOLN
60.0000 mg | ORAL | Status: DC
  Administered 2021-06-20 – 2021-06-28 (×47): 60 mg
  Filled 2021-06-20 (×46): qty 10

## 2021-06-20 NOTE — Progress Notes (Signed)
   06/21/2021 0931  Provider Notification  Provider Name/Title N. Nundkumar MD  Date Provider Notified 07/17/2021  Time Provider Notified 0830  Notification Type Face-to-face  Notification Reason Other (Comment) (continued decreased UOP)  Provider response See new orders (Increase IVF to 150ml/hr and 1L NS bolus)  Date of Provider Response 07/16/2021  Time of Provider Response 0830

## 2021-06-20 NOTE — Progress Notes (Signed)
eLink Physician-Brief Progress Note Patient Name: Kelly Gillespie DOB: 03/27/1986 MRN: [id number]   Date of Service  06/26/2021  HPI/Events of Note  minimal uop tonight. Not on any maintenance fluids, on vent. Can we start some fluids???  SAH. EVD. Cr normal earlier. Glucose 91.  eICU Interventions  - NS started at 75 ml/hr for now.  - POC glucose 4 hrly, if low need dextrose fluids.      Intervention Category Intermediate Interventions: Oliguria - evaluation and management  Ranee Gosselin 07/17/2021, 12:00 AM

## 2021-06-20 NOTE — Progress Notes (Signed)
  NEUROSURGERY PROGRESS NOTE   Pt seen and examined. Had decrease in hourly UO overnight. Otherwise no changes  EXAM: Temp:  [97.1 F (36.2 C)-98.7 F (37.1 C)] 97.7 F (36.5 C) (12/04 0739) Pulse Rate:  [70-115] 74 (12/04 0723) Resp:  [12-21] 14 (12/04 0723) BP: (101-138)/(68-100) 106/77 (12/04 0700) SpO2:  [98 %-100 %] 100 % (12/04 0700) Arterial Line BP: (101-149)/(63-97) 138/73 (12/04 0700) FiO2 (%):  [30 %-40 %] 30 % (12/04 0723) Weight:  [54 kg] 54 kg (12/04 0500) Intake/Output      12/03 0701 12/04 0700 12/04 0701 12/05 0700   I.V. (mL/kg) 1443.7 (26.7)    IV Piggyback 724.9    Total Intake(mL/kg) 2168.6 (40.2)    Urine (mL/kg/hr) 1955 (1.5) 12 (0.1)   Drains 264 14   Blood 15    Total Output 2234 26   Net -65.4 -26         Off propofol: Eyes open to voice Pupils reactive Follows commands BUE/BLE Moves both sides symmetrically  LABS: Lab Results  Component Value Date   CREATININE 0.69 07-17-21   BUN 11 07-17-21   NA 136 July 17, 2021   K 3.9 07-17-21   CL 107 Jul 17, 2021   CO2 22 Jul 17, 2021   Lab Results  Component Value Date   WBC 11.1 (H) 07/17/21   HGB 10.8 (L) 2021/07/17   HCT 32.3 (L) 07/17/21   MCV 84.3 Jul 17, 2021   PLT 201 Jul 17, 2021    IMAGING: Repeat CTH yesterday demonstrates stable SAH, good position of EVD. Improved HCP.  IMPRESSION: - 35 y.o. female SAH d#1 with blister right PCA aneurysm, started on DAPT yesterday with loading dose ASA/Brilinta - HCP, EVD in place - Decreased UO. Cr this am 0.69    PLAN: - Will proceed with Pipeline embolization of RPCA aneurysm this am - Cont EVD drainage at - Will bolus 1L NS and increase IVF to 125cc/hr   Lisbeth Renshaw, MD [facility]

## 2021-06-20 NOTE — Progress Notes (Signed)
eLink Physician-Brief Progress Note Patient Name: Kelly Gillespie DOB: [birth year] MRN: [id number]   Date of Service  2021/06/25  HPI/Events of Note  RN concerned about low urine output. Pt has only had 20cc dark concentrated urine out over past 2 hours  Looks dry, volume down as per RN discussion.  MAP > 65. Ventriculostomy.  ICP < 20. MAP> 90. CCP > 70 good.  eICU Interventions  - LR bolus 500 ml over an hour.      Intervention Category Intermediate Interventions: Oliguria - evaluation and management  Ranee Gosselin 06-25-2021, 2:09 AM

## 2021-06-20 NOTE — Sedation Documentation (Signed)
Deployment Pipeline device [phone]

## 2021-06-20 NOTE — Progress Notes (Signed)
Urine output remains low despite maintenance fluids and LR bolus about 10-15cc an hour. E-link aware. Will continue to monitor.

## 2021-06-20 NOTE — Anesthesia Preprocedure Evaluation (Signed)
Anesthesia Evaluation  Patient identified by MRN, date of birth, ID band Patient unresponsive    Reviewed: Unable to perform ROS - Chart review only  Airway Mallampati: Intubated       Dental   Pulmonary neg pulmonary ROS,    breath sounds clear to auscultation       Cardiovascular negative cardio ROS   Rhythm:Regular Rate:Tachycardia     Neuro/Psych SAH    GI/Hepatic negative GI ROS, Neg liver ROS,   Endo/Other  diabetes, Type 2  Renal/GU negative Renal ROS     Musculoskeletal   Abdominal   Peds  Hematology negative hematology ROS (+)   Anesthesia Other Findings   Reproductive/Obstetrics                             Anesthesia Physical  Anesthesia Plan  ASA: 3  Anesthesia Plan: General   Post-op Pain Management: Minimal or no pain anticipated   Induction: Intravenous  PONV Risk Score and Plan: 3 and Treatment may vary due to age or medical condition  Airway Management Planned: Oral ETT  Additional Equipment: Arterial line and CVP  Intra-op Plan:   Post-operative Plan: Post-operative intubation/ventilation  Informed Consent: I have reviewed the patients History and Physical, chart, labs and discussed the procedure including the risks, benefits and alternatives for the proposed anesthesia with the patient or authorized representative who has indicated his/her understanding and acceptance.     History available from chart only  Plan Discussed with: CRNA and Anesthesiologist  Anesthesia Plan Comments:         Anesthesia Quick Evaluation

## 2021-06-20 NOTE — Consult Note (Addendum)
NAME:  Kelly Gillespie, MRN:  [id number], DOB:  20-Sep-1985, LOS: 1 ADMISSION DATE:  07/03/2021, CONSULTATION DATE:  06/23/2021 REFERRING MD:  Dr. Conchita Paris , CHIEF COMPLAINT:  Rupture cerebral aneurysm    History of Present Illness:  35 yo female presented with headache, vomiting and then AMS.  Noted to have Lt side weakness in ER and asymmetric pupils with SBP 220.  CT head showed aneurysmal bleeding with large volume SAH.  Had EVD placed by neurosurgery and then had cerebral angiogram showing wide neck Rt P1 aneurysm.  She required intubation for airway protection.  PCCM asked to assist with management in ICU.  Pertinent  Medical History  DM type 2, Hypothyroidism, Seizures, Depression, Migraine headaches  Significant Hospital Events: Including procedures, antibiotic start and stop dates in addition to other pertinent events   12/3 Admitted after being found with AMS and decreased LOC. Code stroke called, head CT positive for [facility]  Interim History / Subjective:  Remains on vent, sedation.  Objective   Blood pressure 106/77, pulse 74, temperature 97.7 F (36.5 C), temperature source Axillary, resp. rate 14, height 5\' 2"  (1.575 m), weight 54 kg, SpO2 100 %.    Vent Mode: PRVC FiO2 (%):  [30 %-40 %] 30 % Set Rate:  [14 bmp] 14 bmp Vt Set:  [400 mL] 400 mL PEEP:  [5 cmH20] 5 cmH20 Plateau Pressure:  [11 cmH20-18 cmH20] 14 cmH20   Intake/Output Summary (Last 24 hours) at 07/08/2021 0802 Last data filed at 07/06/2021 0700 Gross per 24 hour  Intake 2075.35 ml  Output 1904 ml  Net 171.35 ml   Filed Weights   06/18/2021 0152 07/11/2021 0500  Weight: 54.2 kg 54 kg    Examination:  General - sedated Eyes - pupils reactive ENT - ETT in place Cardiac - regular rate/rhythm, no murmur Chest - equal breath sounds b/l, no wheezing or rales Abdomen - soft, non tender, + bowel sounds Extremities - no cyanosis, clubbing, or edema Skin - no rashes Neuro - RASS -2  Resolved Hospital Problem  list   Hypertensive emergency  Assessment & Plan:   Wide-neck Rt P1 aneurysm with SAH. - for flow diverting stent after loading with dual antiplatelet therapy (ASA, brilinta) per neurosurgery  Compromised airway. - continue full vent support until neuro status improved - f/u CXR - goal SpO2 > 92%  Sedation. - RASS goal -2 to -3  DM type 2 poorly controlled with hyperglycemia. - SSI - hold outpt metformin, ozempic, actos, semglee  Oliguria. - continue IV fluids - monitor renal fx, urine outpt  Hx of hypothyroidism. - continue synthroid  Hx of ADD/depression. - hold outpt adderall  - continue prozac, wellbutrin  D/w Dr. 14/04/22  Best Practice (right click and "Reselect all SmartList Selections" daily)   Diet/type: tubefeeds after she returns from OR DVT prophylaxis: SCD GI prophylaxis: PPI Lines: Central line and Arterial Line Foley:  Yes, and it is still needed Code Status:  full code Last date of multidisciplinary goals of care discussion: Updated pt's mother at bedside  Labs    CMP Latest Ref Rng & Units 07/17/2021 07/07/2021 07/06/2021  Glucose 70 - 99 mg/dL 14/09/2020) 91 -  BUN 6 - 20 mg/dL 11 7 -  Creatinine 423(N - 1.00 mg/dL 3.61 4.43 -  Sodium 1.54 - 145 mmol/L 136 139 138  Potassium 3.5 - 5.1 mmol/L 3.9 3.6 2.7(LL)  Chloride 98 - 111 mmol/L 107 109 -  CO2 22 - 32 mmol/L 22 24 -  Calcium 8.9 - 10.3 mg/dL 8.2(L) 7.7(L) -  Total Protein 6.5 - 8.1 g/dL - - -  Total Bilirubin 0.3 - 1.2 mg/dL - - -  Alkaline Phos 38 - 126 U/L - - -  AST 15 - 41 U/L - - -  ALT 0 - 44 U/L - - -    CBC Latest Ref Rng & Units 07/16/2021 06/25/2021 07/05/2021  WBC 4.0 - 10.5 K/uL 11.1(H) - -  Hemoglobin 12.0 - 15.0 g/dL 10.8(L) 14.3 13.3  Hematocrit 36.0 - 46.0 % 32.3(L) 42.0 39.0  Platelets 150 - 400 K/uL 201 - -    ABG    Component Value Date/Time   PHART 7.436 07/10/2021 0315   PCO2ART 35.5 07/10/2021 0315   PO2ART 211 (H) 06/18/2021 0315   HCO3 23.9 07/01/2021 0315    TCO2 25 07/10/2021 0315   O2SAT 100.0 06/25/2021 0315    CBG (last 3)  Recent Labs    06/18/2021 2314 06/18/2021 0323 06/23/2021 0736  GLUCAP 116* 132* 121*    Critical care time:  32 minutes  Chesley Mires, MD [place] Pager - [phone] 07/03/2021, 8:11 AM

## 2021-06-20 NOTE — Brief Op Note (Signed)
  NEUROSURGERY BRIEF OPERATIVE  NOTE   PREOP DX: SAH, Right P1 aneurysm  POSTOP DX: Same  PROCEDURE: Pipeline embolization of right PCA aneurysm  SURGEON: Dr. Lisbeth Renshaw, MD  ANESTHESIA: GETA  EBL: Minimal  SPECIMENS: None  COMPLICATIONS: None  CONDITION: Stable to recovery  FINDINGS (Full report in [facility]): 1. Successful Pipeline embolization of right P1 aneurysm   Lisbeth Renshaw, MD [facility]

## 2021-06-20 NOTE — Transfer of Care (Signed)
Immediate Anesthesia Transfer of Care Note  Patient: Kelly Gillespie  Procedure(s) Performed: IR WITH ANESTHESIA  Patient Location: NICU  Anesthesia Type:General  Level of Consciousness: Patient remains intubated per anesthesia plan  Airway & Oxygen Therapy: Patient remains intubated per anesthesia plan  Post-op Assessment: Report given to RN and Post -op Vital signs reviewed and stable  Post vital signs: Reviewed and stable  Last Vitals:  Vitals Value Taken Time  BP    Temp    Pulse 70 06/26/2021 1229  Resp 14 07/17/2021 1229  SpO2 95 % 06/22/2021 1229  Vitals shown include unvalidated device data.  Last Pain:  Vitals:   07/08/2021 0739  TempSrc: Axillary         Complications: No notable events documented.

## 2021-06-20 NOTE — Anesthesia Postprocedure Evaluation (Signed)
Anesthesia Post Note  Patient: Kelly Gillespie  Procedure(s) Performed: IR WITH ANESTHESIA     Patient location during evaluation: SICU Anesthesia Type: General Level of consciousness: sedated Pain management: pain level controlled Vital Signs Assessment: post-procedure vital signs reviewed and stable Respiratory status: patient remains intubated per anesthesia plan Cardiovascular status: stable Postop Assessment: no apparent nausea or vomiting Anesthetic complications: no   No notable events documented.  Last Vitals:  Vitals:   06/17/2021 1445 06/23/2021 1500  BP: 122/87 115/83  Pulse: 100 (!) 104  Resp: 17 16  Temp:    SpO2: 99% 100%    Last Pain:  Vitals:   07/10/2021 1230  TempSrc: Axillary                 Kennieth Rad

## 2021-06-21 ENCOUNTER — Inpatient Hospital Stay (HOSPITAL_COMMUNITY): Payer: BC Managed Care – PPO

## 2021-06-21 ENCOUNTER — Encounter (HOSPITAL_COMMUNITY): Payer: Self-pay | Admitting: Neurosurgery

## 2021-06-21 DIAGNOSIS — I609 Nontraumatic subarachnoid hemorrhage, unspecified: Secondary | ICD-10-CM

## 2021-06-21 DIAGNOSIS — J988 Other specified respiratory disorders: Secondary | ICD-10-CM | POA: Diagnosis not present

## 2021-06-21 LAB — TSH: TSH: 0.474 u[IU]/mL (ref 0.350–4.500)

## 2021-06-21 LAB — CBC
HCT: 31.7 % — ABNORMAL LOW (ref 36.0–46.0)
Hemoglobin: 10.7 g/dL — ABNORMAL LOW (ref 12.0–15.0)
MCH: 28.5 pg (ref 26.0–34.0)
MCHC: 33.8 g/dL (ref 30.0–36.0)
MCV: 84.3 fL (ref 80.0–100.0)
Platelets: 210 10*3/uL (ref 150–400)
RBC: 3.76 MIL/uL — ABNORMAL LOW (ref 3.87–5.11)
RDW: 12.9 % (ref 11.5–15.5)
WBC: 19.8 10*3/uL — ABNORMAL HIGH (ref 4.0–10.5)
nRBC: 0 % (ref 0.0–0.2)

## 2021-06-21 LAB — GLUCOSE, CAPILLARY
Glucose-Capillary: 162 mg/dL — ABNORMAL HIGH (ref 70–99)
Glucose-Capillary: 169 mg/dL — ABNORMAL HIGH (ref 70–99)
Glucose-Capillary: 312 mg/dL — ABNORMAL HIGH (ref 70–99)
Glucose-Capillary: 300 mg/dL — ABNORMAL HIGH (ref 70–99)
Glucose-Capillary: 265 mg/dL — ABNORMAL HIGH (ref 70–99)
Glucose-Capillary: 233 mg/dL — ABNORMAL HIGH (ref 70–99)

## 2021-06-21 LAB — BASIC METABOLIC PANEL
Anion gap: 7 (ref 5–15)
BUN: 6 mg/dL (ref 6–20)
CO2: 22 mmol/L (ref 22–32)
Calcium: 7.9 mg/dL — ABNORMAL LOW (ref 8.9–10.3)
Chloride: 105 mmol/L (ref 98–111)
Creatinine, Ser: 0.51 mg/dL (ref 0.44–1.00)
GFR, Estimated: >60 mL/min (ref >60)
Glucose, Bld: 160 mg/dL — ABNORMAL HIGH (ref 70–99)
Potassium: 3 mmol/L — ABNORMAL LOW (ref 3.5–5.1)
Sodium: 134 mmol/L — ABNORMAL LOW (ref 135–145)

## 2021-06-21 MED ORDER — MAGNESIUM SULFATE 4 GM/100ML IV SOLN
4.0000 g | Freq: Once | INTRAVENOUS | Status: AC
  Administered 2021-06-21: 4 g via INTRAVENOUS
  Filled 2021-06-21: qty 100

## 2021-06-21 MED ORDER — INSULIN ASPART 100 UNIT/ML IJ SOLN
2.0000 [IU] | INTRAMUSCULAR | Status: DC
  Administered 2021-06-21 – 2021-06-22 (×7): 2 [IU] via SUBCUTANEOUS

## 2021-06-21 MED ORDER — POTASSIUM CHLORIDE 10 MEQ/50ML IV SOLN
10.0000 meq | INTRAVENOUS | Status: AC
  Administered 2021-06-21 (×2): 10 meq via INTRAVENOUS
  Filled 2021-06-21 (×2): qty 50

## 2021-06-21 MED ORDER — OSMOLITE 1.2 CAL PO LIQD
1000.0000 mL | ORAL | Status: AC
  Administered 2021-06-21 – 2021-06-24 (×4): 1000 mL

## 2021-06-21 MED ORDER — PROSOURCE TF PO LIQD
45.0000 mL | Freq: Every day | ORAL | Status: DC
  Administered 2021-06-22 – 2021-06-24 (×3): 45 mL
  Filled 2021-06-21 (×3): qty 45

## 2021-06-21 MED ORDER — POTASSIUM PHOSPHATES 15 MMOLE/5ML IV SOLN
15.0000 mmol | Freq: Once | INTRAVENOUS | Status: AC
  Administered 2021-06-21: 15 mmol via INTRAVENOUS
  Filled 2021-06-21: qty 5

## 2021-06-21 MED ORDER — ADULT MULTIVITAMIN W/MINERALS CH
1.0000 | ORAL_TABLET | Freq: Every day | ORAL | Status: DC
  Administered 2021-06-21 – 2021-06-27 (×7): 1
  Filled 2021-06-21 (×7): qty 1

## 2021-06-21 MED ORDER — POTASSIUM CHLORIDE 20 MEQ PO PACK
20.0000 meq | PACK | Freq: Once | ORAL | Status: AC
  Administered 2021-06-21: 20 meq
  Filled 2021-06-21: qty 1

## 2021-06-21 NOTE — Progress Notes (Signed)
   06/21/21 1145  Provider Notification  Provider Name/Title R. Agarwala  Date Provider Notified 06/21/21  Time Provider Notified 1145  Notification Type Page  Notification Reason Change in status (desat O2)  Provider response See new orders (increase PEEP to 8, CXR)  Date of Provider Response 06/21/21  Time of Provider Response 1145   Pt unable to tolerate wean this AM, see prior note from respiratory therapist 0930.   1145 pt continues to desat 87-89%, no secretions noted, vent without alarms. Pt agitated, fentanyl gtt resumed at low dose. Orders obtained to increase PEEP to 8, obtain CXR and initiate CPT (CPT not tolerated by patient).

## 2021-06-21 NOTE — Progress Notes (Addendum)
Transcranial Doppler  Date POD PCO2 HCT BP  MCA ACA PCA OPHT SIPH VERT Basilar  12/5 MES     Right  Left   69 50 -63  -36   48  26   23  24    43  40   *  *   *           Right  Left                                            Right  Left                                             Right  Left                                             Right  Left                                            Right  Left                                            Right  Left                                        MCA = Middle Cerebral Artery      OPHT = Opthalmic Artery     BASILAR = Basilar Artery   ACA = Anterior Cerebral Artery     SIPH = Carotid Siphon PCA = Posterior Cerebral Artery   VERT = Verterbral Artery                   Normal MCA = 62+\-12 ACA = 50+\-12 PCA = 42+\-23  Rt lindegaard ratio: 2.09 Lt lindgaard ratio: 2.17  Kelly Gillespie 06/21/2021 11:54 AM

## 2021-06-21 NOTE — Progress Notes (Signed)
Pharmacy Electrolyte Replacement  Recent Labs:  Recent Labs    07/16/2021 1700 06/21/21 0545  K  --  3.0*  MG 1.4*  --   PHOS 2.2*  --   CREATININE  --  0.51    Low Critical Values (K </= 2.5, Phos </= 1, Mg </= 1) Present: None  MD Contacted: Agarwala  Plan: Mag sulfate 4g IV x 1 KPhos IV x 1 K runs x 2 + KCl PT x 1 Recheck K, Mag, Phos with AM labs   Leia Alf, PharmD, BCPS Please check AMION for all [facility] contact numbers Clinical Pharmacist 06/21/2021 8:33 AM

## 2021-06-21 NOTE — Progress Notes (Signed)
   06/21/21 1745  Provider Notification  Provider Name/Title N. Nundkumar  Date Provider Notified 06/21/21  Time Provider Notified 1745  Notification Type Page  Notification Reason Other (Comment) (update)  Provider response No new orders  Date of Provider Response 06/21/21  Time of Provider Response 1745    Updated MD N. Nundkumar on today's events since AM rounds. Update including: not able to extubate pt, increase in FiO2 to 70%, repeat CXR obtained, and pt remains following commands.

## 2021-06-21 NOTE — Progress Notes (Signed)
Pt returned to full support settings at this time. Pt with desaturations despite increase in FiO2 to 60%. Pt also alarming with low respiratory rate/apneas. Dr. Denese Killings notified. RN aware of pt returning to full support also. RT will continue to monitor and be available as needed. Extubation order cancelled at this time.

## 2021-06-21 NOTE — Progress Notes (Signed)
OT Cancellation Note  Patient Details Name: Kelly Gillespie MRN: [id number] DOB: 05/24/1986   Cancelled Treatment:    Reason Eval/Treat Not Completed: Patient not medically ready (Pt had to be switched back to full vent support. Will attempt later time.)  Revision [facility] Quavion Boule, OT/L   Acute OT Clinical Specialist Acute Rehabilitation Services Pager [phone] Office [phone]  06/21/2021, 10:15 AM

## 2021-06-21 NOTE — Progress Notes (Signed)
Initial Nutrition Assessment  DOCUMENTATION CODES:   Not applicable  INTERVENTION:   Tube feeding via OG tube: D/C Vital High Protein  Osmolite 1.2 at 60 ml/h (1440 ml per day) Prosource TF 45 ml daily   MVI with minerals daily   Provides 1768 kcal, 90 gm protein, 1167 ml free water daily  Propofol and cleviprex providing additional kcals but weaning.   Monitor magnesium and phosphorus every 12 hours x 4 occurrences, MD to replete as needed, as pt is at risk for refeeding syndrome given weight loss PTA.  NUTRITION DIAGNOSIS:   Increased nutrient needs related to post-op healing as evidenced by estimated needs.  GOAL:   Patient will meet greater than or equal to 90% of their needs  MONITOR:   TF tolerance  REASON FOR ASSESSMENT:   Consult, Ventilator Enteral/tube feeding initiation and management  ASSESSMENT:   Pt with PMH of DM, hypothyroidism, seizures, depression and migraines admitted with AMS, L side weakness from large volume SAH and RLL aspiration.     12/3 s/p EVD placement; TF started 12/4 s/p pipeline embolization of R PCA aneurysm   Pt discussed during ICU rounds and with RN.  Per MD plan to wean cleviprex and wean to extubate as mental status allows.  Spoke with pt's boyfriend and father. Boyfriend reports that in the last year to 18 months pt has lost a lot of weight. Per chart review this would be 31% during that time. Of note pt is on multiple home medications that can cause weight loss.  Adderall, Wellbutrin XL, Prozac, Synthroid, Metformin, Ozempic, Actos, and Semglee.   Boyfriend reports typical intake. Breakfast: none Lunch: mac and cheese or other quick/precooked meal Dinner: none or small amount Grazes on cheese sticks, cheddar popcorn   Patient is currently intubated on ventilator support MV: 5.4 L/min Temp (24hrs), Avg:98.8 F (37.1 C), Min:97.9 F (36.6 C), Max:99.1 F (37.3 C)  Propofol: 11 ml/hr provides: 290 kcal  Cleviprex  @ 8 ml/hr provides: 384 kcal  Medications reviewed and include: colace, SSI, miralax NS @ 100 ml/hr Fentanyl  Mag sulfate x 1  KCl Kphos x 1  Labs reviewed: Na 134, K: 3, PO4: 2.2, Magnesium: 1.4 A1C: 6.4 CBG's: 108-169   ICP: 262 ml UOP: 2862 ml   OG tube; tip gastric per xray  Current TF:  Vital High Protein @ 40 ml/hr with 45 ml ProSource TF BID  Provides: 1040 kcal and 106 grams protein   NUTRITION - FOCUSED PHYSICAL EXAM:  Flowsheet Row Most Recent Value  Orbital Region No depletion  Upper Arm Region No depletion  Thoracic and Lumbar Region No depletion  Buccal Region Unable to assess  Temple Region Mild depletion  Clavicle Bone Region Mild depletion  Clavicle and Acromion Bone Region Mild depletion  Scapular Bone Region Mild depletion  Dorsal Hand Unable to assess  Patellar Region Mild depletion  Anterior Thigh Region Mild depletion  Posterior Calf Region Mild depletion  Edema (RD Assessment) None  Hair Reviewed  Eyes Unable to assess  Mouth Unable to assess  Skin Reviewed  Nails Unable to assess       Diet Order:   Diet Order             Diet NPO time specified  Diet effective now                   EDUCATION NEEDS:   Not appropriate for education at this time  Skin:  Skin Assessment: Reviewed  RN Assessment  Last BM:  12/4 smear  Height:   Ht Readings from Last 1 Encounters:  07/09/2021 _0  (1.575 m)    Weight:   Wt Readings from Last 1 Encounters:  06/21/21 55.4 kg    BMI:  Body mass index is 22.34 kg/m.  Estimated Nutritional Needs:   Kcal:  1700-1900  Protein:  80-90 grams  Fluid:  >1.7 L/day  Lockie Pares., RD, LDN, CNSC See AMiON for contact information

## 2021-06-21 NOTE — Progress Notes (Signed)
SLP Cancellation Note  Patient Details Name: Kelly Gillespie MRN: [id number] DOB: 11-30-1985   Cancelled treatment:       Reason Eval/Treat Not Completed: Patient not medically ready. Remains ventialted per chart, will follow for readiness.    Diane Hanel, Riley Nearing 06/21/2021, 9:37 AM

## 2021-06-21 NOTE — Consult Note (Signed)
NAME:  Kelly Gillespie, MRN:  [id number], DOB:  23-Aug-1985, LOS: 2 ADMISSION DATE:  07/13/2021, CONSULTATION DATE:  07/08/2021 REFERRING MD:  Dr. Conchita Paris , CHIEF COMPLAINT:  Rupture cerebral aneurysm    History of Present Illness:  35 yo female presented with headache, vomiting and then AMS.  Noted to have Lt side weakness in ER and asymmetric pupils with SBP 220.  CT head showed aneurysmal bleeding with large volume SAH.  Had EVD placed by neurosurgery and then had cerebral angiogram showing wide neck Rt P1 aneurysm.  She required intubation for airway protection.  PCCM asked to assist with management in ICU.  Pertinent  Medical History  DM type 2, Hypothyroidism, Seizures, Depression, Migraine headaches  Significant Hospital Events: Including procedures, antibiotic start and stop dates in addition to other pertinent events   12/3 Admitted after being found with AMS and decreased LOC. Code stroke called, head CT positive for [facility] 12/4  Pipeline embolization of right PCA aneurysm  Interim History / Subjective:  Remains mechanically ventilated on sedation. Now post ictus day 3, post embolization day 1.  Objective   Blood pressure 125/74, pulse 86, temperature 98.4 F (36.9 C), temperature source Axillary, resp. rate 12, height 5\' 2"  (1.575 m), weight 55.4 kg, SpO2 97 %.    Vent Mode: CPAP;PSV FiO2 (%):  [30 %-40 %] 40 % Set Rate:  [14 bmp] 14 bmp Vt Set:  [400 mL] 400 mL PEEP:  [5 cmH20] 5 cmH20 Pressure Support:  [10 cmH20] 10 cmH20 Plateau Pressure:  [11 cmH20-13 cmH20] 13 cmH20   Intake/Output Summary (Last 24 hours) at 06/21/2021 0816 Last data filed at 06/21/2021 0800 Gross per 24 hour  Intake 5265.55 ml  Output 3234 ml  Net 2031.55 ml    Filed Weights   07/04/2021 0152 06/26/2021 0500 06/21/21 0500  Weight: 54.2 kg 54 kg 55.4 kg    Examination:  General - intubated, sedated and on cleviprex Eyes - pupils 94mm slowly reactive ENT - ETT in place Cardiac - regular  rate/rhythm, no murmur Chest - equal breath sounds b/l, no wheezing or rales. Tolerating PSV Abdomen - soft, non tender, + bowel sounds Extremities - no cyanosis, clubbing, or edema Skin - no rashes Neuro - with sedation paused: not following commands but will flex to pain in all limbs. Semi-purposefully reaching for ETT.   Ancillary tests personally reviewed.   CXR 12/4: infiltrate posterior segment RLL.  Hypokalemia 3.0 Leukocytosis 19. 8  Assessment & Plan:   Critically ill due to acute Hunt-Hess 4 , Fischer 4 aneurysmal SAH requiring mechanical ventilation for airway protection.  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 Hx of ADD/depression.  Plan:   - Aneurysm now secured.  Sedation vacation and extubate once following commands. -Hold on antibiotics for now.  Given age and otherwise normal lungs should be able to kill her secretions and her oxygenation may actually improve once extubated. -Continue Cleviprex today blood pressure goal less than 160.  Will liberalize starting tomorrow -Baseline TCD today. -EVD drainage set to 10 mmHg -Continue nimodipine for vasospasm prevention -Maintain euvolemia. -Continue continue home fluoxetine, Wellbutrin, Synthroid. -Restart home Adderall once extubated.   Best Practice (right click and "Reselect all SmartList Selections" daily)   Diet/type: tubefeeds after she returns from OR DVT prophylaxis: SCD GI prophylaxis: PPI Lines: Central line and Arterial Line  Foley:  Yes, and it is still needed Code Status:  full code Last date of multidisciplinary goals of care discussion: Updated pt's mother and father at bedside 12/5.  CRITICAL CARE Performed by: Lynnell Catalan  Total critical care time: 40 minutes  Critical care time was  exclusive of separately billable procedures and treating other patients.  Critical care was necessary to treat or prevent imminent or life-threatening deterioration.  Critical care was time spent personally by me on the following activities: development of treatment plan with patient and/or surrogate as well as nursing, discussions with consultants, evaluation of patient's response to treatment, examination of patient, obtaining history from patient or surrogate, ordering and performing treatments and interventions, ordering and review of laboratory studies, ordering and review of radiographic studies, pulse oximetry, re-evaluation of patient's condition and participation in multidisciplinary rounds.  Lynnell Catalan, MD [facility] ICU Physician [facility]  Pager: [phone] Mobile: [phone] After hours: [phone]. 06/21/2021, 8:16 AM

## 2021-06-21 NOTE — Progress Notes (Signed)
Inpatient Diabetes Program Recommendations  AACE/ADA: New Consensus Statement on Inpatient Glycemic Control (2015)  Target Ranges:  Prepandial:   less than 140 mg/dL      Peak postprandial:   less than 180 mg/dL (1-2 hours)      Critically ill patients:  140 - 180 mg/dL   Lab Results  Component Value Date   GLUCAP 312 (H) 06/21/2021   HGBA1C 6.4 (H) 07/07/2021    Review of Glycemic Control  Latest Reference Range & Units 06/21/21 03:17 06/21/21 07:59 06/21/21 12:12  Glucose-Capillary 70 - 99 mg/dL 354 (H) 562 (H) 563 (H)  (H): Data is abnormally high Diabetes history: Type 2 DM Outpatient Diabetes medications: Metformin 500 mg QD, Ozempic 1 mg qwk, Actos 15 mg qd, Semglee 6-8 units QD Current orders for Inpatient glycemic control: Novolog 0-9 units Q4H Vital @60  ml/hr  Inpatient Diabetes Program Recommendations:    Consider adding Novolog 2 units Q4H for tube feed coverage (to be stopped or held in the event tube feeds stopped).  Thanks, , MSN, RNC-OB Diabetes Coordinator [phone] (8a-5p)

## 2021-06-21 NOTE — Progress Notes (Signed)
CPT initiated per physician order. Pt did not tolerate well and CPT was stopped at this time. Pt trying to sit up in bed, kicking legs and moving arms around. RN aware. RT will continue to monitor.

## 2021-06-21 NOTE — Progress Notes (Signed)
  NEUROSURGERY PROGRESS NOTE   Pt seen and examined. Pt required increased FiO2 overnight, otherwise no events.  EXAM: Temp:  [96.2 F (35.7 C)-99.1 F (37.3 C)] 99.1 F (37.3 C) (12/05 0800) Pulse Rate:  [67-105] 86 (12/05 0800) Resp:  [12-20] 12 (12/05 0800) BP: (108-130)/(65-95) 125/74 (12/05 0800) SpO2:  [90 %-100 %] 97 % (12/05 0800) Arterial Line BP: (83-156)/(60-97) 139/66 (12/05 0800) FiO2 (%):  [30 %-50 %] 50 % (12/05 0935) Weight:  [55.4 kg] 55.4 kg (12/05 0500) Intake/Output      12/04 0701 12/05 0700 12/05 0701 12/06 0700   I.V. (mL/kg) 3909 (70.6) 127.5 (2.3)   NG/GT 792.7 40   IV Piggyback 595    Total Intake(mL/kg) 5296.6 (95.6) 167.5 (3)   Urine (mL/kg/hr) 2862 (2.2) 100 (0.6)   Drains 262 11   Stool 0    Blood 25    Total Output 3149 111   Net +2147.6 +56.5        Stool Occurrence 1 x     Off sedation: No eye opening Eyes conjugate, Pupils 66mm reactive Breathing over vent MAE spontaneously, ?RLE>LLE.  Not currently FC Right groin soft  LABS: Lab Results  Component Value Date   CREATININE 0.51 06/21/2021   BUN 6 06/21/2021   NA 134 (L) 06/21/2021   K 3.0 (L) 06/21/2021   CL 105 06/21/2021   CO2 22 06/21/2021   Lab Results  Component Value Date   WBC 19.8 (H) 06/21/2021   HGB 10.7 (L) 06/21/2021   HCT 31.7 (L) 06/21/2021   MCV 84.3 06/21/2021   PLT 210 06/21/2021    IMPRESSION: - 35 y.o. female SAHd# 2, POD#1 pipeline embolization of blister right P1 aneurysm. Exam appears largely non-focal - Decreased UO yesterday improved with fluids  PLAN: - Cont to monitor neurologic exam, extubate when awake enough per PCCM - Cont Nimotop - TCD today - Cont ASA/Brilinta   Lisbeth Renshaw, MD [facility]

## 2021-06-21 NOTE — Progress Notes (Signed)
   06/21/21 0830  Provider Notification  Provider Name/Title N. Nundkumar  Date Provider Notified 06/21/21  Time Provider Notified 0830  Notification Type Rounds  Notification Reason Other (Comment)  Provider response See new orders (Verbal order to lower EVD to (previously ))  Date of Provider Response 06/21/21  Time of Provider Response 0830    Drain care order modified as above per verbal order.

## 2021-06-22 DIAGNOSIS — I609 Nontraumatic subarachnoid hemorrhage, unspecified: Secondary | ICD-10-CM | POA: Diagnosis not present

## 2021-06-22 DIAGNOSIS — J988 Other specified respiratory disorders: Secondary | ICD-10-CM | POA: Diagnosis not present

## 2021-06-22 LAB — GLUCOSE, CAPILLARY
Glucose-Capillary: 275 mg/dL — ABNORMAL HIGH (ref 70–99)
Glucose-Capillary: 278 mg/dL — ABNORMAL HIGH (ref 70–99)
Glucose-Capillary: 266 mg/dL — ABNORMAL HIGH (ref 70–99)
Glucose-Capillary: 317 mg/dL — ABNORMAL HIGH (ref 70–99)
Glucose-Capillary: 356 mg/dL — ABNORMAL HIGH (ref 70–99)
Glucose-Capillary: 197 mg/dL — ABNORMAL HIGH (ref 70–99)

## 2021-06-22 LAB — PHOSPHORUS
Phosphorus: 1.6 mg/dL — ABNORMAL LOW (ref 2.5–4.6)
Phosphorus: 3.6 mg/dL (ref 2.5–4.6)

## 2021-06-22 LAB — MAGNESIUM: Magnesium: 2.2 mg/dL (ref 1.7–2.4)

## 2021-06-22 LAB — BASIC METABOLIC PANEL
Anion gap: 5 (ref 5–15)
BUN: 11 mg/dL (ref 6–20)
CO2: 23 mmol/L (ref 22–32)
Calcium: 7.8 mg/dL — ABNORMAL LOW (ref 8.9–10.3)
Chloride: 112 mmol/L — ABNORMAL HIGH (ref 98–111)
Creatinine, Ser: 0.59 mg/dL (ref 0.44–1.00)
GFR, Estimated: >60 mL/min (ref >60)
Glucose, Bld: 241 mg/dL — ABNORMAL HIGH (ref 70–99)
Potassium: 3.4 mmol/L — ABNORMAL LOW (ref 3.5–5.1)
Sodium: 140 mmol/L (ref 135–145)

## 2021-06-22 MED ORDER — ACETAMINOPHEN 160 MG/5ML PO SOLN
ORAL | Status: AC
  Administered 2021-06-22: 650 mg
  Filled 2021-06-22: qty 20.3

## 2021-06-22 MED ORDER — AMPHETAMINE-DEXTROAMPHETAMINE 10 MG PO TABS
20.0000 mg | ORAL_TABLET | Freq: Three times a day (TID) | ORAL | Status: DC
Start: 2021-06-22 — End: 2021-06-28
  Administered 2021-06-22 – 2021-06-27 (×14): 20 mg
  Filled 2021-06-22 (×17): qty 2

## 2021-06-22 MED ORDER — SODIUM CHLORIDE 0.9 % IV SOLN
2.0000 g | INTRAVENOUS | Status: AC
  Administered 2021-06-22 – 2021-06-28 (×7): 2 g via INTRAVENOUS
  Filled 2021-06-22 (×7): qty 20

## 2021-06-22 MED ORDER — SODIUM CHLORIDE 0.9 % IV SOLN: INTRAVENOUS | Status: DC | PRN

## 2021-06-22 MED ORDER — BUSPIRONE HCL 10 MG PO TABS
10.0000 mg | ORAL_TABLET | Freq: Three times a day (TID) | ORAL | Status: DC
  Administered 2021-06-22 – 2021-06-27 (×16): 10 mg
  Filled 2021-06-22 (×16): qty 1

## 2021-06-22 MED ORDER — ACETAMINOPHEN 160 MG/5ML PO SOLN
650.0000 mg | ORAL | Status: DC | PRN
  Administered 2021-06-22 – 2021-06-24 (×9): 650 mg
  Filled 2021-06-22 (×9): qty 20.3

## 2021-06-22 MED ORDER — PANTOPRAZOLE 2 MG/ML SUSPENSION
40.0000 mg | Freq: Every day | ORAL | Status: DC
  Administered 2021-06-22 – 2021-06-29 (×8): 40 mg
  Filled 2021-06-22 (×8): qty 20

## 2021-06-22 MED ORDER — SODIUM CHLORIDE 0.9 % IV SOLN
1.0000 g | INTRAVENOUS | Status: DC
  Filled 2021-06-22: qty 10

## 2021-06-22 MED ORDER — POTASSIUM PHOSPHATES 15 MMOLE/5ML IV SOLN
45.0000 mmol | Freq: Once | INTRAVENOUS | Status: AC
  Administered 2021-06-22: 45 mmol via INTRAVENOUS
  Filled 2021-06-22: qty 15

## 2021-06-22 MED ORDER — INSULIN GLARGINE-YFGN 100 UNIT/ML ~~LOC~~ SOLN
15.0000 [IU] | Freq: Every day | SUBCUTANEOUS | Status: DC
  Administered 2021-06-22 – 2021-06-23 (×2): 15 [IU] via SUBCUTANEOUS
  Filled 2021-06-22 (×3): qty 0.15

## 2021-06-22 MED ORDER — INSULIN ASPART 100 UNIT/ML IJ SOLN
6.0000 [IU] | INTRAMUSCULAR | Status: DC
  Administered 2021-06-22 – 2021-06-28 (×29): 6 [IU] via SUBCUTANEOUS

## 2021-06-22 MED ORDER — ACETAMINOPHEN 160 MG/5ML PO SOLN
ORAL | Status: AC
  Filled 2021-06-22: qty 20.3

## 2021-06-22 MED ORDER — SODIUM CHLORIDE 0.9 % IV BOLUS
1000.0000 mL | Freq: Once | INTRAVENOUS | Status: AC
  Administered 2021-06-22: 1000 mL via INTRAVENOUS

## 2021-06-22 MED ORDER — AMPHETAMINE-DEXTROAMPHETAMINE 10 MG PO TABS: 20.0000 mg | ORAL_TABLET | Freq: Three times a day (TID) | ORAL | Status: DC

## 2021-06-22 NOTE — Progress Notes (Signed)
  NEUROSURGERY PROGRESS NOTE   Pt seen and examined. Pt unable to extubate yesterday, had increased O2 requirement but has been FC throughout the day and night.  EXAM: Temp:  [97.9 F (36.6 C)-102.9 F (39.4 C)] 102.9 F (39.4 C) (12/06 0759) Pulse Rate:  [87-117] 103 (12/06 1100) Resp:  [14-26] 17 (12/06 1100) BP: (116-138)/(63-79) 122/69 (12/06 1100) SpO2:  [87 %-98 %] 97 % (12/06 1100) Arterial Line BP: (112-145)/(54-71) 126/56 (12/06 1100) FiO2 (%):  [60 %-70 %] 65 % (12/06 0740) Weight:  [56.3 kg] 56.3 kg (12/06 0500) Intake/Output      12/05 0701 12/06 0700 12/06 0701 12/07 0700   I.V. (mL/kg) 2791.3 (49.6) 430.6 (7.6)   NG/GT 952.7    IV Piggyback 439 10.4   Total Intake(mL/kg) 4183 (74.3) 440.9 (7.8)   Urine (mL/kg/hr) 3310 (2.4) 175 (0.7)   Drains 233 34   Stool     Blood     Total Output 3543 209   Net +640 +231.9         Currently No eye opening, grimaces to pain Eyes conjugate, Pupils 41mm reactive Breathing over vent MAE spontaneously, localizes  Not currently FC Right groin soft  Previously per RN, pt was following commands briskly all extremities  LABS: Lab Results  Component Value Date   CREATININE 0.59 06/22/2021   BUN 11 06/22/2021   NA 140 06/22/2021   K 3.4 (L) 06/22/2021   CL 112 (H) 06/22/2021   CO2 23 06/22/2021   Lab Results  Component Value Date   WBC 19.8 (H) 06/21/2021   HGB 10.7 (L) 06/21/2021   HCT 31.7 (L) 06/21/2021   MCV 84.3 06/21/2021   PLT 210 06/21/2021   TCD:   Date POD PCO2 HCT BP   MCA ACA PCA OPHT SIPH VERT Basilar  12/5 MES         Right  Left   69 50 -63  -36   48  26   23  24    43  40   *  *   *         IMPRESSION: - 35 y.o. female SAHd# 3, POD#2 pipeline embolization of blister right P1 aneurysm. Neurologic exam appears good - Increased FiO2, RLL infiltrate, likely aspiration pneumonia  PLAN: - Cont to monitor neurologic exam, attempt to minimize sedation - Vent mgmt per PCCM - Plan on  starting abx for likely aspiration pneumonia - Cont Nimotop - TCD tomorrow - Cont ASA/Brilinta   31, MD [facility]

## 2021-06-22 NOTE — Progress Notes (Signed)
Discussion with Dr. Denese Killings regarding the central line. I asked if we could remove and he gave verbal orders to keep a little longer. Will reassess the need tomorrow.

## 2021-06-22 NOTE — Progress Notes (Signed)
Pharmacy Electrolyte Replacement  Recent Labs:  Recent Labs    06/22/21 0500  K 3.4*  MG 2.2  PHOS 1.6*  CREATININE 0.59    Low Critical Values (K </= 2.5, Phos </= 1, Mg </= 1) Present: None  MD Contacted: n/a - no critical values noted  Plan:  Give KPhos 45 mmol IV x 1 (will receive ~66 meq of K in KPhos) - No further K required Recheck Phos at 2000 tonight per protocol   Leia Alf, PharmD, BCPS Please check AMION for all [facility] contact numbers Clinical Pharmacist 06/22/2021 10:34 AM

## 2021-06-22 NOTE — Progress Notes (Signed)
NAME:  Kelly Gillespie, MRN:  [id number], DOB:  04/09/1986, LOS: 3 ADMISSION DATE:  07/01/2021, CONSULTATION DATE:  07/01/2021 REFERRING MD:  Dr. Conchita Paris , CHIEF COMPLAINT:  Rupture cerebral aneurysm    History of Present Illness:  35 yo female presented with headache, vomiting and then AMS.  Noted to have Lt side weakness in ER and asymmetric pupils with SBP 220.  CT head showed aneurysmal bleeding with large volume SAH.  Had EVD placed by neurosurgery and then had cerebral angiogram showing wide neck Rt P1 aneurysm.  She required intubation for airway protection.  PCCM asked to assist with management in ICU.  Pertinent  Medical History  DM type 2, Hypothyroidism, Seizures, Depression, Migraine headaches  Significant Hospital Events: Including procedures, antibiotic start and stop dates in addition to other pertinent events   12/3 Admitted after being found with AMS and decreased LOC. Code stroke called, head CT positive for [facility] 12/4  Pipeline embolization of right PCA aneurysm 12/5 follows commands on sedation interruption and but unable to wean due to hypoxia.  Interim History / Subjective:  Remains mechanically ventilated on sedation. Now post ictus day 4, post embolization day 2.  Oxygen increased to 70%, fever to 101 F  Objective   Blood pressure 124/67, pulse (!) 112, temperature (!) 100.7 F (38.2 C), temperature source Axillary, resp. rate 15, height 5\' 2"  (1.575 m), weight 56.3 kg, SpO2 97 %.    Vent Mode: PRVC FiO2 (%):  [40 %-70 %] 70 % Set Rate:  [14 bmp] 14 bmp Vt Set:  [400 mL] 400 mL PEEP:  [5 cmH20-8 cmH20] 8 cmH20 Pressure Support:  [10 cmH20] 10 cmH20 Plateau Pressure:  [14 cmH20-15 cmH20] 14 cmH20   Intake/Output Summary (Last 24 hours) at 06/22/2021 0727 Last data filed at 06/22/2021 0700 Gross per 24 hour  Intake 4182.99 ml  Output 3543 ml  Net 639.99 ml    Filed Weights   07/17/2021 0500 06/21/21 0500 06/22/21 0500  Weight: 54 kg 55.4 kg 56.3 kg     Examination:  General - intubated, sedated, Cleviprex is off Eyes - pupils 26mm slowly reactive ENT - ETT in place Cardiac - regular rate/rhythm, no murmur Chest - Crackles at right base.  Abdomen - soft, non tender, + bowel sounds Extremities - no cyanosis, clubbing, or edema Skin - no rashes Neuro - with sedation paused: not following commands but will flex to pain in all limbs. Semi-purposefully reaching for ETT.  EVD clearing.   Ancillary tests personally reviewed.   CXR 12/5: infiltrate posterior segment RLL has worsened. Hypokalemia 3.0 Leukocytosis 19. 8 Worsening hyperglycemia  Normal TCD velocities 12/4 Assessment & Plan:   Critically ill due to acute Hunt-Hess 4 , Fischer 4 aneurysmal SAH requiring mechanical ventilation for airway protection.  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 Hx of ADD/depression.  Plan:   - Aneurysm now secured.  Sedation vacation and extubate once following commands. -Continue full ventilator support. Increase PEEP. - Sputum culture and start empiric CAP coverage with ceftriaxone (MRSA negative) -Liberalize BP goals to SBP 180 -Continue TCD qMWF -EVD drainage set to 10 mmHg -Continue nimodipine for vasospasm prevention -Maintain euvolemia. -Continue continue home fluoxetine, Wellbutrin, Synthroid. -Restart home Adderall   Best Practice (right click and "Reselect all SmartList Selections" daily)   Diet/type: tubefeeds after she returns from OR  DVT prophylaxis: SCD GI prophylaxis: PPI Lines: Central line and Arterial Line Foley:  Yes, and it is still needed Code Status:  full code Last date of multidisciplinary goals of care discussion: Updated pt's mother and father at bedside 12/5.  CRITICAL CARE Performed by: Lynnell Catalan  Total critical care time: 40 minutes  Critical care time was exclusive of separately billable procedures and treating other patients.  Critical care was necessary to treat or prevent imminent or life-threatening deterioration.  Critical care was time spent personally by me on the following activities: development of treatment plan with patient and/or surrogate as well as nursing, discussions with consultants, evaluation of patient's response to treatment, examination of patient, obtaining history from patient or surrogate, ordering and performing treatments and interventions, ordering and review of laboratory studies, ordering and review of radiographic studies, pulse oximetry, re-evaluation of patient's condition and participation in multidisciplinary rounds.  Lynnell Catalan, MD [facility] ICU Physician [facility] [place] [facility]  Pager: [phone] Mobile: [phone] After hours: [phone]. 06/22/2021, 7:27 AM

## 2021-06-22 NOTE — Progress Notes (Signed)
OT Cancellation Note  Patient Details Name: LACREASHA HINDS MRN: [id number] DOB: [birth year]   Cancelled Treatment:    Reason Eval/Treat Not Completed: Patient not medically ready. Discussed with nsg. Peep increased to 10; 60% FiO2, nsg weaning sedation. Will follow up later time and attempt tomorrow if appropriate.    Thornell Mule, OT/L   Acute OT Clinical Specialist Acute Rehabilitation Services Pager [phone] Office [phone]  06/22/2021, 10:28 AM

## 2021-06-22 NOTE — Progress Notes (Signed)
PT Cancellation Note  Patient Details Name: KEILY LEPP MRN: [id number] DOB: [birth year]   Cancelled Treatment:    Reason Eval/Treat Not Completed: Patient not medically ready (PEEP incr to 10) 60% FiO2, nsg weaning sedation. Will check back at later time.   Lyanne Co, PT  Acute Rehab Services  Pager [phone] Office [phone]    Lawana Chambers Cayleb Jarnigan 06/22/2021, 12:58 PM

## 2021-06-23 ENCOUNTER — Inpatient Hospital Stay (HOSPITAL_COMMUNITY): Payer: BC Managed Care – PPO

## 2021-06-23 DIAGNOSIS — I609 Nontraumatic subarachnoid hemorrhage, unspecified: Secondary | ICD-10-CM | POA: Diagnosis not present

## 2021-06-23 DIAGNOSIS — J988 Other specified respiratory disorders: Secondary | ICD-10-CM | POA: Diagnosis not present

## 2021-06-23 LAB — GLUCOSE, CAPILLARY
Glucose-Capillary: 216 mg/dL — ABNORMAL HIGH (ref 70–99)
Glucose-Capillary: 270 mg/dL — ABNORMAL HIGH (ref 70–99)
Glucose-Capillary: 294 mg/dL — ABNORMAL HIGH (ref 70–99)
Glucose-Capillary: 268 mg/dL — ABNORMAL HIGH (ref 70–99)
Glucose-Capillary: 168 mg/dL — ABNORMAL HIGH (ref 70–99)
Glucose-Capillary: 210 mg/dL — ABNORMAL HIGH (ref 70–99)

## 2021-06-23 LAB — CBC WITH DIFFERENTIAL/PLATELET
Abs Immature Granulocytes: 0 10*3/uL (ref 0.00–0.07)
Basophils Absolute: 0 10*3/uL (ref 0.0–0.1)
Basophils Relative: 0 %
Eosinophils Absolute: 0 10*3/uL (ref 0.0–0.5)
Eosinophils Relative: 0 %
HCT: 30.3 % — ABNORMAL LOW (ref 36.0–46.0)
Hemoglobin: 9.9 g/dL — ABNORMAL LOW (ref 12.0–15.0)
Lymphocytes Relative: 1 %
Lymphs Abs: 0.2 10*3/uL — ABNORMAL LOW (ref 0.7–4.0)
MCH: 28.2 pg (ref 26.0–34.0)
MCHC: 32.7 g/dL (ref 30.0–36.0)
MCV: 86.3 fL (ref 80.0–100.0)
Monocytes Absolute: 0.2 10*3/uL (ref 0.1–1.0)
Monocytes Relative: 1 %
Neutro Abs: 15.6 10*3/uL — ABNORMAL HIGH (ref 1.7–7.7)
Neutrophils Relative %: 98 %
Platelets: 250 10*3/uL (ref 150–400)
RBC: 3.51 MIL/uL — ABNORMAL LOW (ref 3.87–5.11)
RDW: 14.3 % (ref 11.5–15.5)
WBC: 15.9 10*3/uL — ABNORMAL HIGH (ref 4.0–10.5)
nRBC: 0 % (ref 0.0–0.2)
nRBC: 0 /100 WBC

## 2021-06-23 LAB — TRIGLYCERIDES: Triglycerides: 106 mg/dL (ref <150)

## 2021-06-23 MED ORDER — HEPARIN SODIUM (PORCINE) 5000 UNIT/ML IJ SOLN
5000.0000 [IU] | Freq: Three times a day (TID) | INTRAMUSCULAR | Status: DC
  Administered 2021-06-23 – 2021-06-29 (×20): 5000 [IU] via SUBCUTANEOUS
  Filled 2021-06-23 (×20): qty 1

## 2021-06-23 NOTE — Progress Notes (Signed)
Transcranial Doppler   Date POD PCO2 HCT BP   MCA ACA PCA OPHT SIPH VERT Basilar  12/5 MES         Right  Left   69 50 -63  -36   48  26   23  24    43  40   *  *   *       12/7 RH          Right  Left    110   102    -33   -65    56   60    22   23    *   *    -15   -27    *                 Right  Left                                                                 Right  Left                                                                 Right  Left                                                               Right  Left                                                               Right  Left                                                       MCA = Middle Cerebral Artery      OPHT = Opthalmic Artery     BASILAR = Basilar Artery   ACA = Anterior Cerebral Artery     SIPH = Carotid Siphon PCA = Posterior Cerebral Artery   VERT = Verterbral Artery                    Normal MCA = 62+\-12 ACA = 50+\-12 PCA = 42+\-23   Rt lindegaard ratio: 5.79  Lt lindgaard ratio: 3.29  14/7, RDMS, RVT 06-23-2021

## 2021-06-23 NOTE — Progress Notes (Signed)
PT Cancellation Note  Patient Details Name: MARGAN ELIAS MRN: [id number] DOB: 07/19/1985   Cancelled Treatment:    Reason Eval/Treat Not Completed: Medical issues which prohibited therapy - pt on sedation, vent support. Will check back tomorrow.   Marye Round, PT DPT Acute Rehabilitation Services Pager [phone]  Office [phone]    Truddie Coco 06/23/2021, 3:01 PM

## 2021-06-23 NOTE — Progress Notes (Signed)
SLP Cancellation Note  Patient Details Name: Kelly Gillespie MRN: [id number] DOB: 1985-12-09   Cancelled treatment:       Reason Eval/Treat Not Completed: Patient not medically ready, remains on vent. Will f/u as able.    Mahala Menghini., M.A. CCC-SLP Acute Rehabilitation Services Pager [phone] Office [phone]  06/23/2021, 7:43 AM

## 2021-06-23 NOTE — Progress Notes (Signed)
  NEUROSURGERY PROGRESS NOTE   Pt seen and examined. No issues overnight   EXAM: Temp:  [99.2 F (37.3 C)-101.9 F (38.8 C)] 101.9 F (38.8 C) (12/07 1200) Pulse Rate:  [106-132] 114 (12/07 1600) Resp:  [12-27] 23 (12/07 1600) BP: (116-141)/(60-76) 126/68 (12/07 1600) SpO2:  [90 %-97 %] 94 % (12/07 1600) Arterial Line BP: (110-149)/(55-69) 143/67 (12/07 1500) FiO2 (%):  [40 %-60 %] 50 % (12/07 1509) Weight:  [59.3 kg] 59.3 kg (12/07 0500) Intake/Output      12/06 0701 12/07 0700 12/07 0701 12/08 0700   I.V. (mL/kg) 2527.1 (42.6) 1087.7 (18.3)   NG/GT 1590 540   IV Piggyback 1613.2 100   Total Intake(mL/kg) 5730.3 (96.6) 1727.7 (29.1)   Urine (mL/kg/hr) 1375 (1) 500 (0.9)   Drains 212 72   Stool  0   Total Output 1587 572   Net +4143.3 +1155.7        Urine Occurrence 1 x    Stool Occurrence  1 x    Currently Eyes open to stim Eyes conjugate, Pupils 58mm reactive Breathing over vent FC BLE, moves BUE spontaneously  LABS: Lab Results  Component Value Date   CREATININE 0.59 06/22/2021   BUN 11 06/22/2021   NA 140 06/22/2021   K 3.4 (L) 06/22/2021   CL 112 (H) 06/22/2021   CO2 23 06/22/2021   Lab Results  Component Value Date   WBC 15.9 (H) 06/23/2021   HGB 9.9 (L) 06/23/2021   HCT 30.3 (L) 06/23/2021   MCV 86.3 06/23/2021   PLT 250 06/23/2021   TCD:   Date POD PCO2 HCT BP   MCA ACA PCA OPHT SIPH VERT Basilar  12/5 MES         Right  Left   69 50 -63  -36   48  26   23  24    43  40   *  *   *         IMPRESSION: - 35 y.o. female SAHd# 4, POD#3 pipeline embolization of blister right P1 aneurysm. Neurologic exam appears stable - RLL infiltrate, likely aspiration pneumonia  PLAN: - Cont to monitor neurologic exam - Vent mgmt per PCCM - Cont abx for aspiration pneumonia - Cont Nimotop - Cont ASA/Brilinta   31, MD [facility]

## 2021-06-23 NOTE — Progress Notes (Signed)
NAME:  Kelly Gillespie, MRN:  [id number], DOB:  1986/01/16, LOS: 4 ADMISSION DATE:  07/12/2021, CONSULTATION DATE:  07/02/2021 REFERRING MD:  Dr. Conchita Paris , CHIEF COMPLAINT:  Rupture cerebral aneurysm    History of Present Illness:  35 yo female presented with headache, vomiting and then AMS.  Noted to have Lt side weakness in ER and asymmetric pupils with SBP 220.  CT head showed aneurysmal bleeding with large volume SAH.  Had EVD placed by neurosurgery and then had cerebral angiogram showing wide neck Rt P1 aneurysm.  She required intubation for airway protection.  PCCM asked to assist with management in ICU.  Pertinent  Medical History  DM type 2, Hypothyroidism, Seizures, Depression, Migraine headaches  Significant Hospital Events: Including procedures, antibiotic start and stop dates in addition to other pertinent events   12/3 Admitted after being found with AMS and decreased LOC. Code stroke called, head CT positive for [facility] 12/4  Pipeline embolization of right PCA aneurysm 12/5 follows commands on sedation interruption and but unable to wean due to hypoxia.  12/6 treatment for pneumonia started.   Interim History / Subjective:  Remains mechanically ventilated on sedation. Now post ictus day 5, post embolization day 3.  FiO2 decreasing overnight. Fever curve improving.  Objective   Blood pressure 126/69, pulse (!) 117, temperature 99.4 F (37.4 C), temperature source Axillary, resp. rate 14, height 5\' 2"  (1.575 m), weight 59.3 kg, SpO2 95 %.    Vent Mode: PRVC FiO2 (%):  [50 %-60 %] 50 % Set Rate:  [14 bmp] 14 bmp Vt Set:  [400 mL] 400 mL PEEP:  [10 cmH20] 10 cmH20 Plateau Pressure:  [10 cmH20-17 cmH20] 13 cmH20   Intake/Output Summary (Last 24 hours) at 06/23/2021 0747 Last data filed at 06/23/2021 0600 Gross per 24 hour  Intake 5670.32 ml  Output 1572 ml  Net 4098.32 ml    Filed Weights   06/21/21 0500 06/22/21 0500 06/23/21 0500  Weight: 55.4 kg 56.3 kg 59.3 kg     Examination:  General - intubated, sedated, Cleviprex is off Eyes - pupils 79mm slowly reactive ENT - ETT in place Cardiac - regular rate/rhythm, no murmur Chest - Crackles at right base.  Abdomen - soft, non tender, + bowel sounds Extremities - no cyanosis, clubbing, or edema Skin - no rashes Neuro - with sedation paused: not following commands but will flex to pain in all limbs. Semi-purposefully reaching for ETT.  EVD clearing.   Ancillary tests personally reviewed.   CXR 12/5: infiltrate posterior segment RLL has worsened. Leukocytosis 15.9 improving Worsening hyperglycemia  Normal TCD velocities 12/4 Assessment & Plan:   Critically ill due to acute Hunt-Hess 4 , Fischer 4 aneurysmal SAH requiring mechanical ventilation for airway protection.  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 Hx of ADD/depression.  Plan:   - Aneurysm now secured.  Minimize sedation and extubate once oxygenation allows.  -Continue full ventilator support.  Start SBT - PS 5/7 -then drop to 5/5 for 2h prior to full sedation interruption.  - Sputum culture and start empiric CAP coverage with ceftriaxone (MRSA negative) -Liberalize BP goals to SBP 180 -Continue TCD qMWF -EVD drainage set to 10 mmHg -Continue nimodipine for vasospasm prevention -Maintain euvolemia. -Continue continue home fluoxetine, Wellbutrin, Synthroid. -Restart home Adderall   Best Practice (right click and "Reselect all SmartList  Selections" daily)   Diet/type: tubefeeds  DVT prophylaxis: heparin [place] and SCD GI prophylaxis: PPI Lines: Central line and Arterial Line Foley:  Yes, and it is still needed Code Status:  full code Last date of multidisciplinary goals of care discussion: Updated pt's mother and father at bedside  12/5.  CRITICAL CARE Performed by: Lynnell Catalan  Total critical care time: 40 minutes  Critical care time was exclusive of separately billable procedures and treating other patients.  Critical care was necessary to treat or prevent imminent or life-threatening deterioration.  Critical care was time spent personally by me on the following activities: development of treatment plan with patient and/or surrogate as well as nursing, discussions with consultants, evaluation of patient's response to treatment, examination of patient, obtaining history from patient or surrogate, ordering and performing treatments and interventions, ordering and review of laboratory studies, ordering and review of radiographic studies, pulse oximetry, re-evaluation of patient's condition and participation in multidisciplinary rounds.  Lynnell Catalan, MD [facility] ICU Physician [facility] [place] Critical Care  Pager: [phone] Mobile: [phone] After hours: [phone]. 06/23/2021, 7:47 AM

## 2021-06-23 NOTE — Progress Notes (Signed)
Inpatient Diabetes Program Recommendations  AACE/ADA: New Consensus Statement on Inpatient Glycemic Control (2015)  Target Ranges:  Prepandial:   less than 140 mg/dL      Peak postprandial:   less than 180 mg/dL (1-2 hours)      Critically ill patients:  140 - 180 mg/dL   Lab Results  Component Value Date   GLUCAP 270 (H) 06/23/2021   HGBA1C 6.4 (H) 07/03/2021    Review of Glycemic Control  Latest Reference Range & Units 06/22/21 15:25 06/22/21 19:24 06/22/21 23:24 06/23/21 03:06 06/23/21 07:20  Glucose-Capillary 70 - 99 mg/dL 397 (H) 673 (H) 419 (H) 216 (H) 270 (H)   Diabetes history: DM 2 Outpatient Diabetes medications:  Ozempic 1 mg weekly, Metformin 500 mg daily, Actos 15 mg daily, Semglee 6-8 units daily Current orders for Inpatient glycemic control:  Novolog 6 units q 4 hours Novolog sensitive q 4 hours Semglee 15 units daily Osmolite 1.2 cal 60 ml/hr  Inpatient Diabetes Program Recommendations:    Please consider increasing Novolog tube feed coverage to 8 units q 4 hours and increase Semglee to 20 units daily.    Thanks,  Beryl Meager, RN, BC-ADM Inpatient Diabetes Coordinator Pager [phone]  (8a-5p)

## 2021-06-23 NOTE — Progress Notes (Signed)
OT Cancellation Note  Patient Details Name: CELESE BANNER MRN: [id number] DOB: 1986/06/22   Cancelled Treatment:    Reason Eval/Treat Not Completed: Patient not medically ready (EVD vent sedation) OT to check back as appropriate.  [name], OTR/L  Acute Rehabilitation Services Pager: [phone] Office: [phone] .  06/23/2021, 2:47 PM

## 2021-06-24 ENCOUNTER — Inpatient Hospital Stay (HOSPITAL_COMMUNITY): Payer: BC Managed Care – PPO

## 2021-06-24 DIAGNOSIS — I609 Nontraumatic subarachnoid hemorrhage, unspecified: Secondary | ICD-10-CM | POA: Diagnosis not present

## 2021-06-24 LAB — CBC WITH DIFFERENTIAL/PLATELET
Abs Immature Granulocytes: 0 10*3/uL (ref 0.00–0.07)
Band Neutrophils: 2 %
Basophils Absolute: 0.2 10*3/uL — ABNORMAL HIGH (ref 0.0–0.1)
Basophils Relative: 1 %
Eosinophils Absolute: 0 10*3/uL (ref 0.0–0.5)
Eosinophils Relative: 0 %
HCT: 29.6 % — ABNORMAL LOW (ref 36.0–46.0)
Hemoglobin: 9.7 g/dL — ABNORMAL LOW (ref 12.0–15.0)
Lymphocytes Relative: 5 %
Lymphs Abs: 0.9 10*3/uL (ref 0.7–4.0)
MCH: 28.3 pg (ref 26.0–34.0)
MCHC: 32.8 g/dL (ref 30.0–36.0)
MCV: 86.3 fL (ref 80.0–100.0)
Monocytes Absolute: 0.7 10*3/uL (ref 0.1–1.0)
Monocytes Relative: 4 %
Neutro Abs: 15.3 10*3/uL — ABNORMAL HIGH (ref 1.7–7.7)
Neutrophils Relative %: 88 %
Platelets: 288 10*3/uL (ref 150–400)
RBC: 3.43 MIL/uL — ABNORMAL LOW (ref 3.87–5.11)
RDW: 14.5 % (ref 11.5–15.5)
WBC: 17 10*3/uL — ABNORMAL HIGH (ref 4.0–10.5)
nRBC: 0 /100 WBC
nRBC: 0.2 % (ref 0.0–0.2)

## 2021-06-24 LAB — GLUCOSE, CAPILLARY
Glucose-Capillary: 209 mg/dL — ABNORMAL HIGH (ref 70–99)
Glucose-Capillary: 154 mg/dL — ABNORMAL HIGH (ref 70–99)
Glucose-Capillary: 164 mg/dL — ABNORMAL HIGH (ref 70–99)
Glucose-Capillary: 172 mg/dL — ABNORMAL HIGH (ref 70–99)
Glucose-Capillary: 202 mg/dL — ABNORMAL HIGH (ref 70–99)
Glucose-Capillary: 189 mg/dL — ABNORMAL HIGH (ref 70–99)

## 2021-06-24 LAB — CULTURE, RESPIRATORY W GRAM STAIN

## 2021-06-24 LAB — BASIC METABOLIC PANEL
Anion gap: 7 (ref 5–15)
BUN: 33 mg/dL — ABNORMAL HIGH (ref 6–20)
CO2: 22 mmol/L (ref 22–32)
Calcium: 8.4 mg/dL — ABNORMAL LOW (ref 8.9–10.3)
Chloride: 117 mmol/L — ABNORMAL HIGH (ref 98–111)
Creatinine, Ser: 0.8 mg/dL (ref 0.44–1.00)
GFR, Estimated: >60 mL/min (ref >60)
Glucose, Bld: 261 mg/dL — ABNORMAL HIGH (ref 70–99)
Potassium: 4.2 mmol/L (ref 3.5–5.1)
Sodium: 146 mmol/L — ABNORMAL HIGH (ref 135–145)

## 2021-06-24 LAB — T4, FREE: Free T4: 1.38 ng/dL — ABNORMAL HIGH (ref 0.61–1.12)

## 2021-06-24 LAB — PHOSPHORUS: Phosphorus: 2.6 mg/dL (ref 2.5–4.6)

## 2021-06-24 LAB — TSH: TSH: 0.184 u[IU]/mL — ABNORMAL LOW (ref 0.350–4.500)

## 2021-06-24 MED ORDER — INSULIN ASPART 100 UNIT/ML IJ SOLN
0.0000 [IU] | INTRAMUSCULAR | Status: DC
  Administered 2021-06-24 (×2): 3 [IU] via SUBCUTANEOUS
  Administered 2021-06-24: 5 [IU] via SUBCUTANEOUS
  Administered 2021-06-25 (×5): 3 [IU] via SUBCUTANEOUS
  Administered 2021-06-26: 5 [IU] via SUBCUTANEOUS
  Administered 2021-06-26: 2 [IU] via SUBCUTANEOUS
  Administered 2021-06-26: 8 [IU] via SUBCUTANEOUS
  Administered 2021-06-26: 2 [IU] via SUBCUTANEOUS
  Administered 2021-06-26: 3 [IU] via SUBCUTANEOUS
  Administered 2021-06-27: 8 [IU] via SUBCUTANEOUS
  Administered 2021-06-27: 2 [IU] via SUBCUTANEOUS
  Administered 2021-06-27: 8 [IU] via SUBCUTANEOUS
  Administered 2021-06-27: 3 [IU] via SUBCUTANEOUS
  Administered 2021-06-27: 5 [IU] via SUBCUTANEOUS
  Administered 2021-06-27: 3 [IU] via SUBCUTANEOUS
  Administered 2021-06-28 (×2): 8 [IU] via SUBCUTANEOUS
  Administered 2021-06-28: 15 [IU] via SUBCUTANEOUS

## 2021-06-24 MED ORDER — ACETAMINOPHEN 325 MG PO TABS
650.0000 mg | ORAL_TABLET | Freq: Four times a day (QID) | ORAL | Status: DC | PRN
  Administered 2021-06-24 – 2021-06-28 (×9): 650 mg
  Filled 2021-06-24 (×10): qty 2

## 2021-06-24 MED ORDER — VANCOMYCIN HCL 750 MG/150ML IV SOLN: 750.0000 mg | Freq: Two times a day (BID) | INTRAVENOUS | Status: DC

## 2021-06-24 MED ORDER — INSULIN GLARGINE-YFGN 100 UNIT/ML ~~LOC~~ SOLN
20.0000 [IU] | Freq: Every day | SUBCUTANEOUS | Status: DC
  Filled 2021-06-24: qty 0.2

## 2021-06-24 MED ORDER — NUTRISOURCE FIBER PO PACK
1.0000 | PACK | Freq: Two times a day (BID) | ORAL | Status: DC
Start: 2021-06-24 — End: 2021-06-28
  Administered 2021-06-24 – 2021-06-27 (×8): 1
  Filled 2021-06-24 (×10): qty 1

## 2021-06-24 MED ORDER — INSULIN GLARGINE-YFGN 100 UNIT/ML ~~LOC~~ SOLN
20.0000 [IU] | Freq: Every day | SUBCUTANEOUS | Status: DC
  Administered 2021-06-24 – 2021-06-27 (×4): 20 [IU] via SUBCUTANEOUS
  Filled 2021-06-24 (×5): qty 0.2

## 2021-06-24 MED ORDER — VANCOMYCIN HCL 1250 MG/250ML IV SOLN
1250.0000 mg | Freq: Once | INTRAVENOUS | Status: DC
  Filled 2021-06-24: qty 250

## 2021-06-24 MED ORDER — FREE WATER: 100.0000 mL | Freq: Four times a day (QID) | Status: DC

## 2021-06-24 MED ORDER — GLUCERNA 1.2 CAL PO LIQD
1000.0000 mL | ORAL | Status: DC
  Administered 2021-06-24 – 2021-06-27 (×4): 1000 mL
  Filled 2021-06-24 (×6): qty 1000

## 2021-06-24 MED ORDER — IOHEXOL 350 MG/ML SOLN
100.0000 mL | Freq: Once | INTRAVENOUS | Status: AC
  Administered 2021-06-24: 60 mL via INTRAVENOUS

## 2021-06-24 MED ORDER — INSULIN ASPART 100 UNIT/ML IJ SOLN: 0.0000 [IU] | INTRAMUSCULAR | Status: DC

## 2021-06-24 MED ORDER — FREE WATER
100.0000 mL | Freq: Four times a day (QID) | Status: DC
  Administered 2021-06-24 – 2021-06-25 (×4): 100 mL

## 2021-06-24 NOTE — Progress Notes (Signed)
NAME:  Kelly Gillespie, MRN:  [id number], DOB:  21-Nov-1985, LOS: 5 ADMISSION DATE:  07-17-2021, CONSULTATION DATE:  17-Jul-2021 REFERRING MD:  Dr. Conchita Paris , CHIEF COMPLAINT:  Rupture cerebral aneurysm    History of Present Illness:  35 yo female presented with headache, vomiting and then AMS.  Noted to have Lt side weakness in ER and asymmetric pupils with SBP 220.  CT head showed aneurysmal bleeding with large volume SAH.  Had EVD placed by neurosurgery and then had cerebral angiogram showing wide neck Rt P1 aneurysm.  She required intubation for airway protection.  PCCM asked to assist with management in ICU.  Pertinent  Medical History  DM type 2, Hypothyroidism, Seizures, Depression, Migraine headaches  Significant Hospital Events: Including procedures, antibiotic start and stop dates in addition to other pertinent events   12/3 Admitted after being found with AMS and decreased LOC. Code stroke called, head CT positive for [facility] 12/4  Pipeline embolization of right PCA aneurysm 12/5 follows commands on sedation interruption and but unable to wean due to hypoxia.  12/6 treatment for pneumonia started.  12/7 improving oxygenation but still failed SBT. Central line removed.   Interim History / Subjective:  Remains mechanically ventilated on sedation. Now post ictus day 6, post embolization day 4.  Continues to have low grade fever and hypoxia.  Objective   Blood pressure 131/67, pulse (!) 112, temperature (!) 101.8 F (38.8 C), temperature source Axillary, resp. rate (!) 26, height 5\' 2"  (1.575 m), weight 63.5 kg, SpO2 96 %.    Vent Mode: PRVC FiO2 (%):  [40 %-70 %] 70 % Set Rate:  [14 bmp] 14 bmp Vt Set:  [400 mL] 400 mL PEEP:  [5 cmH20-7 cmH20] 5 cmH20 Pressure Support:  [5 cmH20] 5 cmH20 Plateau Pressure:  [12 cmH20] 12 cmH20   Intake/Output Summary (Last 24 hours) at 06/24/2021 0743 Last data filed at 06/24/2021 0600 Gross per 24 hour  Intake 4012.82 ml  Output 1037 ml  Net  2975.82 ml    Filed Weights   06/22/21 0500 06/23/21 0500 06/24/21 0500  Weight: 56.3 kg 59.3 kg 63.5 kg    Examination:  General - intubated, sedated, Cleviprex is off Eyes - pupils 67mm slowly reactive ENT - ETT in place Cardiac - regular rate/rhythm, no murmur Chest - Crackles at right base.  Abdomen - soft, non tender, + bowel sounds Extremities - no cyanosis, clubbing, or edema Skin - no rashes Neuro - with sedation paused: not following commands but will flex to pain in all limbs. Semi-purposefully reaching for ETT.  EVD clearing.   Ancillary tests personally reviewed.   CXR 12/5: infiltrate posterior segment RLL has essentially resolved.  Leukocytosis back up to 17.0 Worsening hyperglycemia  Normal TCD velocities 12/7 Increasing hypernatremia to 146 Sputum + for Staphylococcus aureus.  Assessment & Plan:   Critically ill due to acute Hunt-Hess 4 , Fischer 4 aneurysmal SAH requiring mechanical ventilation for airway protection.  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 Hx of ADD/depression.  Plan:   - Aneurysm now secured.  Minimize sedation and extubate once oxygenation allows.  -Continue full ventilator support.  CT chest to rule out concurrent PE +/- persistent infiltrated.  Resume daily SBT following CT chest.  - Continue empiric CAP coverage with ceftriaxone (MRSA negative) -Liberalize BP goals to SBP 180 -Continue  TCD qMWF -EVD drainage set to 10 mmHg -Continue nimodipine for vasospasm prevention -Maintain euvolemia. -Continue continue home fluoxetine, Wellbutrin, Synthroid and Adderall.   Best Practice (right click and "Reselect all SmartList Selections" daily)   Diet/type: tubefeeds  DVT prophylaxis: heparin [place] and SCD GI prophylaxis: PPI Lines:  Arterial Line Foley:  Yes, and it is still needed Code Status:  full code Last date of multidisciplinary goals of care discussion: Updated pt's mother and father at bedside 12/5.  CRITICAL CARE Performed by: Lynnell Catalan  Total critical care time: 40 minutes  Critical care time was exclusive of separately billable procedures and treating other patients.  Critical care was necessary to treat or prevent imminent or life-threatening deterioration.  Critical care was time spent personally by me on the following activities: development of treatment plan with patient and/or surrogate as well as nursing, discussions with consultants, evaluation of patient's response to treatment, examination of patient, obtaining history from patient or surrogate, ordering and performing treatments and interventions, ordering and review of laboratory studies, ordering and review of radiographic studies, pulse oximetry, re-evaluation of patient's condition and participation in multidisciplinary rounds.  Lynnell Catalan, MD [facility] ICU Physician [facility] [place] Critical Care  Pager: [phone] Mobile: [phone] After hours: [phone]. 06/24/2021, 7:43 AM

## 2021-06-24 NOTE — Progress Notes (Signed)
Patient transported to CT and back to 4N16 without complication.

## 2021-06-24 NOTE — Progress Notes (Signed)
  NEUROSURGERY PROGRESS NOTE   Pt seen and examined. No issues overnight. Remains with increase O2 requirement.  EXAM: Temp:  [100.5 F (38.1 C)-101.8 F (38.8 C)] 100.5 F (38.1 C) (12/08 1200) Pulse Rate:  [101-132] 101 (12/08 1300) Resp:  [16-30] 16 (12/08 1300) BP: (109-146)/(63-79) 134/78 (12/08 1300) SpO2:  [88 %-99 %] 95 % (12/08 1300) Arterial Line BP: (96-157)/(52-84) 130/84 (12/08 1300) FiO2 (%):  [50 %-70 %] 50 % (12/08 1130) Weight:  [63.5 kg] 63.5 kg (12/08 0500) Intake/Output      12/07 0701 12/08 0700 12/08 0701 12/09 0700   I.V. (mL/kg) 2764.4 (43.5) 611.4 (9.6)   NG/GT 1440 400   IV Piggyback 100 99.9   Total Intake(mL/kg) 4304.4 (67.8) 1111.2 (17.5)   Urine (mL/kg/hr) 850 (0.6)    Drains 195 41   Stool 0    Total Output 1045 41   Net +3259.4 +1070.2        Stool Occurrence 1 x     Currently Eyes open to stim, grimaces to pain Eyes conjugate, Pupils 63mm reactive Breathing over vent FC BLE, Localizes BUE  LABS: Lab Results  Component Value Date   CREATININE 0.80 06/24/2021   BUN 33 (H) 06/24/2021   NA 146 (H) 06/24/2021   K 4.2 06/24/2021   CL 117 (H) 06/24/2021   CO2 22 06/24/2021   Lab Results  Component Value Date   WBC 17.0 (H) 06/24/2021   HGB 9.7 (L) 06/24/2021   HCT 29.6 (L) 06/24/2021   MCV 86.3 06/24/2021   PLT 288 06/24/2021   TCD:   Date POD PCO2 HCT BP   MCA ACA PCA OPHT SIPH VERT Basilar  12/5 MES         Right  Left   69 50 -63  -36   48  26   23  24    43  40   *  *   *        12/7 RH          Right  Left    110   102    -33   -65    56   60    22   23    *   *    -15   -27    *      IMPRESSION: - 35 y.o. female SAHd# 5, POD#4 pipeline embolization of blister right P1 aneurysm. Neurologic exam appears stable, non-focal. Mildly increased bilateral MCA velocity however exam not c/w symptomatic spasm - CTA chest (+) small subsegmental RUL PE and severe bilateral pneumonia  PLAN: - Cont to  monitor neurologic exam - Vent mgmt per PCCM - Cont abx for pneumonia - Cont Nimotop - Cont ASA/Brilinta - TCD tomorrow   31, MD [facility]

## 2021-06-24 NOTE — Progress Notes (Signed)
PT Cancellation Note  Patient Details Name: Kelly Gillespie MRN: [id number] DOB: 11-14-1985   Cancelled Treatment:    Reason Eval/Treat Not Completed: Medical issues which prohibited therapy - new PE, check back tomorrow.  Marye Round, PT DPT Acute Rehabilitation Services Pager [phone]  Office [phone]    [name] 06/24/2021, 3:00 PM

## 2021-06-24 NOTE — Progress Notes (Addendum)
Nutrition Follow-up  DOCUMENTATION CODES:   Not applicable  INTERVENTION:   Tube feeding via OG tube:  Glucerna 1.2 at 65 ml/h (1560 ml per day)  D/C Osmolite 1.2 and ProSource TF  MVI with minerals daily  Nutrisource Fiber BID  Provides 1872 kcal, 93 gm protein, 1265 ml free water daily   100 ml free water every 6 hours Total free water: 1665 ml    NUTRITION DIAGNOSIS:   Increased nutrient needs related to post-op healing as evidenced by estimated needs. Ongoing.   GOAL:   Patient will meet greater than or equal to 90% of their needs Met with TF.   MONITOR:   TF tolerance  REASON FOR ASSESSMENT:   Consult, Ventilator Enteral/tube feeding initiation and management  ASSESSMENT:   Pt with PMH of DM, hypothyroidism, seizures, depression and migraines admitted with AMS, L side weakness from large volume SAH and RLL aspiration.     12/3 s/p EVD placement; TF started 12/4 s/p pipeline embolization of R PCA aneurysm   Pt discussed during ICU rounds and with RN.  Weight up 20 lb; noted +10 L since admission  +fever and hypoxia. CT of chest pending for possible PE Plan for daily SBT  Patient is currently intubated on ventilator support MV: 11.7 L/min Temp (24hrs), Avg:101 F (38.3 C), Min:100.5 F (38.1 C), Max:101.8 F (38.8 C)  Medications reviewed and include: colace, SSI, novolog 6 units every 4 hours, semglee 20 units daily (increased today), synthroid, miralax NS @ 100 ml/hr Fentanyl   Labs reviewed: Na 146 TSH: 0474 -> .184 T4: 1.38 A1C: 6.4 CBG's: 154-210   ICP: 195 ml UOP: 850 ml   OG tube; tip gastric per xray Current TF:  Osmolite 1.2 at 60 ml/h with Prosource TF 45 ml daily  Provides: 1768 kcal and 90 grams protein  Diet Order:   Diet Order             Diet NPO time specified  Diet effective now                   EDUCATION NEEDS:   Not appropriate for education at this time  Skin:  Skin Assessment: Reviewed RN  Assessment  Last BM:  12/8 x 2 large  Height:   Ht Readings from Last 1 Encounters:  07/01/2021 5' 2"  (1.575 m)    Weight:   Wt Readings from Last 1 Encounters:  06/24/21 63.5 kg    BMI:  Body mass index is 25.6 kg/m.  Estimated Nutritional Needs:   Kcal:  1700-1900  Protein:  80-90 grams  Fluid:  >1.7 L/day  Lockie Pares., RD, LDN, CNSC See AMiON for contact information

## 2021-06-24 NOTE — Progress Notes (Signed)
eLink Physician-Brief Progress Note Patient Name: SHANASIA IBRAHIM DOB: 06-04-1986 MRN: [id number]   Date of Service  06/24/2021  HPI/Events of Note  Large liquid stools - Nursing request for Flexiseal.   eICU Interventions  Will place Flexiseal.      Intervention Category Major Interventions: Other:  Lenell Antu 06/24/2021, 4:55 AM

## 2021-06-24 NOTE — Progress Notes (Signed)
Inpatient Diabetes Program Recommendations  AACE/ADA: New Consensus Statement on Inpatient Glycemic Control (2015)  Target Ranges:  Prepandial:   less than 140 mg/dL      Peak postprandial:   less than 180 mg/dL (1-2 hours)      Critically ill patients:  140 - 180 mg/dL   Lab Results  Component Value Date   GLUCAP 164 (H) 06/24/2021   HGBA1C 6.4 (H) Jul 16, 2021    Review of Glycemic Control  Latest Reference Range & Units 06/23/21 19:30 06/23/21 23:15 06/24/21 03:10 06/24/21 07:59 06/24/21 11:51  Glucose-Capillary 70 - 99 mg/dL 062 (H) 694 (H) 854 (H) 154 (H) 164 (H)   Diabetes history: DM 2 Outpatient Diabetes medications:  Metformin 500 mg daily Actos 15 mg daily Semglee 6-8 units daily Ozempic 1 mg weekly Current orders for Inpatient glycemic control:  Novolog moderate q 4 hours Novolog 6 units q 4 hours Semglee 20 units daily Osmolite 60 ml/hr  Inpatient Diabetes Program Recommendations:   Consider slight increase in Semglee to 25 units daily.   Thanks,  Beryl Meager, RN, BC-ADM Inpatient Diabetes Coordinator Pager [phone]  (8a-5p)

## 2021-06-24 NOTE — Progress Notes (Signed)
OT Cancellation Note  Patient Details Name: Kelly Gillespie MRN: [id number] DOB: 1985/10/25   Cancelled Treatment:    Reason Eval/Treat Not Completed: Patient at procedure or test/ unavailable;Patient not medically ready (In CT to r/o PE) Will assess once medically cleared.  Thornell Mule, OT/L   Acute OT Clinical Specialist Acute Rehabilitation Services Pager [phone] Office [phone]  06/24/2021, 11:39 AM

## 2021-06-25 ENCOUNTER — Encounter (HOSPITAL_COMMUNITY): Payer: BC Managed Care – PPO

## 2021-06-25 ENCOUNTER — Inpatient Hospital Stay (HOSPITAL_COMMUNITY): Payer: BC Managed Care – PPO

## 2021-06-25 DIAGNOSIS — I609 Nontraumatic subarachnoid hemorrhage, unspecified: Secondary | ICD-10-CM

## 2021-06-25 DIAGNOSIS — J9601 Acute respiratory failure with hypoxia: Secondary | ICD-10-CM | POA: Diagnosis not present

## 2021-06-25 DIAGNOSIS — J15211 Pneumonia due to Methicillin susceptible Staphylococcus aureus: Secondary | ICD-10-CM | POA: Diagnosis not present

## 2021-06-25 DIAGNOSIS — I2699 Other pulmonary embolism without acute cor pulmonale: Secondary | ICD-10-CM

## 2021-06-25 LAB — CBC WITH DIFFERENTIAL/PLATELET
Abs Immature Granulocytes: 0 10*3/uL (ref 0.00–0.07)
Band Neutrophils: 9 %
Basophils Absolute: 0 10*3/uL (ref 0.0–0.1)
Basophils Relative: 0 %
Eosinophils Absolute: 0 10*3/uL (ref 0.0–0.5)
Eosinophils Relative: 0 %
HCT: 29.8 % — ABNORMAL LOW (ref 36.0–46.0)
Hemoglobin: 9.9 g/dL — ABNORMAL LOW (ref 12.0–15.0)
Lymphocytes Relative: 4 %
Lymphs Abs: 0.6 10*3/uL — ABNORMAL LOW (ref 0.7–4.0)
MCH: 28.5 pg (ref 26.0–34.0)
MCHC: 33.2 g/dL (ref 30.0–36.0)
MCV: 85.9 fL (ref 80.0–100.0)
Monocytes Absolute: 0.6 10*3/uL (ref 0.1–1.0)
Monocytes Relative: 4 %
Neutro Abs: 13.6 10*3/uL — ABNORMAL HIGH (ref 1.7–7.7)
Neutrophils Relative %: 83 %
Platelets: 303 10*3/uL (ref 150–400)
RBC: 3.47 MIL/uL — ABNORMAL LOW (ref 3.87–5.11)
RDW: 14.7 % (ref 11.5–15.5)
WBC: 14.8 10*3/uL — ABNORMAL HIGH (ref 4.0–10.5)
nRBC: 0.5 % — ABNORMAL HIGH (ref 0.0–0.2)
nRBC: 2 /100 WBC — ABNORMAL HIGH

## 2021-06-25 LAB — BASIC METABOLIC PANEL
Anion gap: 10 (ref 5–15)
BUN: 36 mg/dL — ABNORMAL HIGH (ref 6–20)
CO2: 24 mmol/L (ref 22–32)
Calcium: 8.5 mg/dL — ABNORMAL LOW (ref 8.9–10.3)
Chloride: 117 mmol/L — ABNORMAL HIGH (ref 98–111)
Creatinine, Ser: 0.77 mg/dL (ref 0.44–1.00)
GFR, Estimated: >60 mL/min (ref >60)
Glucose, Bld: 114 mg/dL — ABNORMAL HIGH (ref 70–99)
Potassium: 4.1 mmol/L (ref 3.5–5.1)
Sodium: 151 mmol/L — ABNORMAL HIGH (ref 135–145)

## 2021-06-25 LAB — GLUCOSE, CAPILLARY
Glucose-Capillary: 107 mg/dL — ABNORMAL HIGH (ref 70–99)
Glucose-Capillary: 160 mg/dL — ABNORMAL HIGH (ref 70–99)
Glucose-Capillary: 186 mg/dL — ABNORMAL HIGH (ref 70–99)
Glucose-Capillary: 197 mg/dL — ABNORMAL HIGH (ref 70–99)
Glucose-Capillary: 200 mg/dL — ABNORMAL HIGH (ref 70–99)
Glucose-Capillary: 284 mg/dL — ABNORMAL HIGH (ref 70–99)

## 2021-06-25 MED ORDER — FUROSEMIDE 10 MG/ML IJ SOLN
40.0000 mg | Freq: Two times a day (BID) | INTRAMUSCULAR | Status: DC
  Administered 2021-06-25 – 2021-06-27 (×6): 40 mg via INTRAVENOUS
  Filled 2021-06-25 (×6): qty 4

## 2021-06-25 MED ORDER — FUROSEMIDE 10 MG/ML IJ SOLN: 40.0000 mg | Freq: Once | INTRAMUSCULAR | Status: DC

## 2021-06-25 MED ORDER — FREE WATER
200.0000 mL | Status: DC
Start: 2021-06-25 — End: 2021-06-30
  Administered 2021-06-25 – 2021-06-29 (×24): 200 mL

## 2021-06-25 NOTE — Evaluation (Signed)
Occupational Therapy Evaluation Patient Details Name: Kelly Gillespie MRN: [id number] DOB: 07-Dec-1985 Today's Date: 06/25/2021   History of Present Illness 35 yo female presents to ED on 12/3 with HA, vomiting and then AMS.  Noted to have L weakness in ER and asymmetric pupils with SBP 220.  CT head showed aneurysmal bleeding with large volume SAH.  Had EVD placed by neurosurgery and then had cerebral angiogram showing wide neck Rt P1 aneurysm. ETT 12/3-present. 12/8 CTPE study shows aspiration pna and a PE, on subcut heparin. LE negative for DVT on 12/9. PMH: hypothyroidism, ADD, depression   Clinical Impression   Patient is s/p EVD placement surgery resulting in functional limitations due to the deficits listed below (see OT problem list). Pt currently dependent for all care and on vent support at this time. Pt with increased HR 120s with movement and BP 170/93 so returned to supine. RN arriving to unclamp drain and made aware of BP.  Patient will benefit from skilled OT acutely to increase independence and safety with ADLS to allow discharge long term acute care needs ( LTACH).       Recommendations for follow up therapy are one component of a multi-disciplinary discharge planning process, led by the attending physician.  Recommendations may be updated based on patient status, additional functional criteria and insurance authorization.   Follow Up Recommendations  OT at Long-term acute care hospital    Assistance Recommended at Discharge Frequent or constant Supervision/Assistance  Functional Status Assessment  Patient has had a recent decline in their functional status and demonstrates the ability to make significant improvements in function in a reasonable and predictable amount of time.  Equipment Recommendations  Wheelchair (measurements OT);Wheelchair cushion (measurements OT);Hospital bed (vent)    Recommendations for Other Services       Precautions / Restrictions  Precautions Precautions: Fall Precaution Comments: vent, EVD, flexiseal,      Mobility Bed Mobility Overal bed mobility: Needs Assistance             General bed mobility comments: total+2 total all aspects. helicopter with vent/ evd supported    Transfers                   General transfer comment: NA      Balance Overall balance assessment: Needs assistance   Sitting balance-Leahy Scale: Zero                                     ADL either performed or assessed with clinical judgement   ADL Overall ADL's : Needs assistance/impaired                                       General ADL Comments: total (A)     Vision Baseline Vision/History: 1 Wears glasses Additional Comments: reports baseline she requries glasses or contacts to see TV in bed. no contacts currently. R gaze preference with a drift toward midline but then back toward the R. response to threat on L eye     Perception     Praxis      Pertinent Vitals/Pain Facial Expression: Relaxed, neutral Body Movements: Absence of movements Muscle Tension: Relaxed Compliance with ventilator (intubated pts.): N/A Vocalization (extubated pts.): N/A CPOT Total: 0     Hand Dominance Left   Extremity/Trunk Assessment Upper Extremity  Assessment Upper Extremity Assessment: RUE deficits/detail;LUE deficits/detail RUE Deficits / Details: no response to pain, no movement noted LUE Deficits / Details: tone present, aline, BP cuff, limited access to digits   Lower Extremity Assessment Lower Extremity Assessment: Defer to PT evaluation   Cervical / Trunk Assessment Cervical / Trunk Assessment: Other exceptions Cervical / Trunk Exceptions: evd present   Communication Communication Communication: Other (comment) (vent)   Cognition Arousal/Alertness: Awake/alert;Suspect due to medications Behavior During Therapy: Flat affect Overall Cognitive Status: Difficult to assess                                        General Comments  noted to have leaking around the flexiseal and voiding bladder with any movement from therapist. RN made aware and pending bladder scan. pt with increased BP with movement and return to supine. FIO2 40% Peep 6    Exercises     Shoulder Instructions      Home Living Family/patient expects to be discharged to:: Private residence Living Arrangements: Spouse/significant other Available Help at Discharge: Family;Available 24 hours/day Type of Home: House Home Access: Stairs to enter [employer] of Steps: 3   Home Layout: One level               Home Equipment: None   Additional Comments: 2 dogs - Boka and Pixel - stuff animals on the bed are to replicate the animals at home      Prior Functioning/Environment Prior Level of Function : Independent/Modified Independent;Working/employed;Driving               ADLs Comments: 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 Problem List: Decreased strength;Decreased range of motion;Decreased activity tolerance;Impaired balance (sitting and/or standing);Impaired vision/perception;Decreased coordination;Decreased cognition;Decreased safety awareness;Decreased knowledge of use of DME or AE;Decreased knowledge of precautions;Cardiopulmonary status limiting activity;Impaired sensation;Impaired UE functional use      OT Treatment/Interventions: Self-care/ADL training;Therapeutic exercise;Neuromuscular education;Energy conservation;DME and/or AE instruction;Manual therapy;Modalities;Therapeutic activities;Cognitive remediation/compensation;Visual/perceptual remediation/compensation;Patient/family education;Balance training    OT Goals(Current goals can be found in the care plan section) Acute Rehab OT Goals Patient Stated Goal: none stated OT Goal Formulation: Patient unable to participate in goal setting Time For Goal Achievement:  07/09/21 Potential to Achieve Goals: Good  OT Frequency: Min 2X/week   Barriers to D/C:            Co-evaluation PT/OT/SLP Co-Evaluation/Treatment: Yes Reason for Co-Treatment: Complexity of the patient's impairments (multi-system involvement);Necessary to address cognition/behavior during functional activity;For patient/therapist safety;To address functional/ADL transfers   OT goals addressed during session: ADL's and self-care;Proper use of Adaptive equipment and DME;Strengthening/ROM      AM-PAC OT "6 Clicks" Daily Activity     Outcome Measure Help from another person eating meals?: Total Help from another person taking care of personal grooming?: Total Help from another person toileting, which includes using toliet, bedpan, or urinal?: Total Help from another person bathing (including washing, rinsing, drying)?: Total Help from another person to put on and taking off regular upper body clothing?: Total Help from another person to put on and taking off regular lower body clothing?: Total 6 Click Score: 6   End of Session Equipment Utilized During Treatment: Oxygen Nurse Communication: Mobility status;Precautions  Activity Tolerance: Patient tolerated treatment well Patient left: in bed;with call bell/phone within reach;with bed alarm set;with family/visitor present  OT Visit Diagnosis:  Muscle weakness (generalized) (M62.81)                Time: 2549-8264 OT Time Calculation (min): 33 min Charges:  OT General Charges $OT Visit: 1 Visit OT Evaluation $OT Eval High Complexity: 1 High   Brynn, OTR/L  Acute Rehabilitation Services Pager: [phone] Office: [phone] .   Mateo Flow 06/25/2021, 2:13 PM

## 2021-06-25 NOTE — Progress Notes (Signed)
Lower extremity venous has been completed.   Preliminary results in CV Proc.   Aundra Millet  06/25/2021 10:54 AM

## 2021-06-25 NOTE — Evaluation (Signed)
Physical Therapy Evaluation Patient Details Name: Kelly Gillespie MRN: [id number] DOB: 1986/03/01 Today's Date: 06/25/2021  History of Present Illness  35 yo female presents to ED on 12/3 with HA, vomiting and then AMS.  Noted to have L weakness in ER and asymmetric pupils with SBP 220.  CT head showed aneurysmal bleeding with large volume SAH.  Had EVD placed by neurosurgery and then had cerebral angiogram showing wide neck Rt P1 aneurysm. ETT 12/3-present. 12/8 CTPE study shows aspiration pna and a PE, on subcut heparin. LE negative for DVT on 12/9. PMH: hypothyroidism, ADD, depression  Clinical Impression   Pt presents with total body weakness, limited response to stimuli beyond reflexive (blink to threat, startle), zero sitting balance, and decreased activity tolerance. Pt to benefit from acute PT to address deficits. Pt requiring total +2 for transition to EOB, pt with no active participation in bed mobility or EOB sitting. Pt does demonstrate increased HR and BP with mobility, RN aware. PT to progress mobility as tolerated, and will continue to follow acutely.         Recommendations for follow up therapy are one component of a multi-disciplinary discharge planning process, led by the attending physician.  Recommendations may be updated based on patient status, additional functional criteria and insurance authorization.  Follow Up Recommendations PT at Long-term acute care hospital    Assistance Recommended at Discharge Frequent or constant Supervision/Assistance  Functional Status Assessment Patient has had a recent decline in their functional status and/or demonstrates limited ability to make significant improvements in function in a reasonable and predictable amount of time  Equipment Recommendations  Other (comment) (tbd)    Recommendations for Other Services       Precautions / Restrictions Precautions Precautions: Fall Precaution Comments: vent, EVD,  flexiseal Restrictions Weight Bearing Restrictions: No      Mobility  Bed Mobility Overal bed mobility: Needs Assistance             General bed mobility comments: total+2 total all aspects. helicopter with vent/ evd supported    Transfers                   General transfer comment: NA    Ambulation/Gait                  Stairs            Wheelchair Mobility    Modified Rankin (Stroke Patients Only) Modified Rankin (Stroke Patients Only) Pre-Morbid Rankin Score: No symptoms Modified Rankin: Severe disability     Balance Overall balance assessment: Needs assistance   Sitting balance-Leahy Scale: Zero                                       Pertinent Vitals/Pain Pain Assessment: CPOT Facial Expression: Relaxed, neutral Body Movements: Absence of movements Muscle Tension: Relaxed Compliance with ventilator (intubated pts.): Coughing but tolerating Vocalization (extubated pts.): N/A CPOT Total: 1 Pain Intervention(s): Monitored during session    Home Living Family/patient expects to be discharged to:: Private residence Living Arrangements: Spouse/significant other Available Help at Discharge: Family;Available 24 hours/day Type of Home: House Home Access: Stairs to enter   [employer] of Steps: 3   Home Layout: One level Home Equipment: None Additional Comments: 2 dogs - Boka and Pixel - stuff animals on the bed are to replicate the animals at home    Prior Function  Prior Level of Function : Independent/Modified Independent;Working/employed;Driving               ADLs Comments: 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 knee with redness and heat bilat LLE Deficits / Details: grimacing during PROM hip and knee flexion/extension, no resistance    Cervical / Trunk Assessment Cervical / Trunk Assessment: Other exceptions Cervical / Trunk Exceptions: evd present  Communication   Communication: Other (comment) (vent)  Cognition Arousal/Alertness: Awake/alert;Suspect due to medications Behavior During Therapy: Flat affect Overall Cognitive Status: Difficult to assess                                          General Comments General comments (skin integrity, edema, etc.): SBP up to 170 at end of session, RN aware and present. FiO2 40%, PEEP 6. + blink to threat, + startle, + pain response LLE    Exercises General Exercises - Lower Extremity Heel Slides: PROM;Both;5 reps;Supine   Assessment/Plan    PT Assessment Patient needs continued PT services  PT Problem List Decreased strength;Decreased mobility;Impaired tone;Decreased safety awareness;Decreased activity tolerance;Decreased balance;Decreased knowledge of use of DME;Pain;Cardiopulmonary status limiting activity       PT Treatment Interventions DME instruction;Therapeutic activities;Gait training;Therapeutic exercise;Patient/family education;Balance training;Functional mobility training;Neuromuscular re-education;Cognitive remediation;Wheelchair mobility training    PT Goals (Current goals can be found in the Care Plan section)  Acute Rehab PT Goals Patient Stated Goal: home PT Goal Formulation: Patient unable to participate in goal setting Time For Goal Achievement: 07/09/21 Potential to Achieve Goals: Fair    Frequency Min 3X/week   Barriers to discharge        Co-evaluation PT/OT/SLP Co-Evaluation/Treatment: Yes Reason for Co-Treatment:  Complexity of the patient's impairments (multi-system involvement);Necessary to address cognition/behavior during functional activity;For patient/therapist safety;To address functional/ADL transfers PT goals addressed during session: Mobility/safety with mobility;Balance;Strengthening/ROM OT goals addressed during session: ADL's and self-care;Proper use of Adaptive equipment and DME;Strengthening/ROM       AM-PAC PT "6 Clicks" Mobility  Outcome Measure Help needed turning from your back to your side while in a flat bed without using bedrails?: Total Help needed moving from lying on your back to sitting on the side of a flat bed without using bedrails?: Total Help needed moving to and from a bed to a chair (including a wheelchair)?: Total Help needed standing up from a chair using your arms (e.g., wheelchair or bedside chair)?: Total Help needed to walk in hospital room?: Total Help needed climbing 3-5 steps with a railing? : Total 6 Click Score: 6    End of Session   Activity Tolerance: Patient limited by fatigue;Patient limited by lethargy Patient left: in bed;with call bell/phone within reach;with bed alarm set;with family/visitor present;with nursing/sitter in room Nurse Communication: Mobility status;Other (comment) (vital sign response to activity) PT Visit Diagnosis: Other abnormalities of gait and mobility (R26.89);Muscle weakness (generalized) (M62.81)    Time: 6568-1275 PT Time Calculation (min) (ACUTE ONLY): 33 min  Charges:   PT Evaluation $PT Eval Moderate Complexity: 1 Mod        Donoven Pett S, PT DPT Acute Rehabilitation Services Pager [phone]  Office [phone]   Truddie Coco 06/25/2021, 2:39 PM

## 2021-06-25 NOTE — Progress Notes (Signed)
RT attempted ABG x2. Blood seemed to be clotting more than normal. RT will monitor as needed.

## 2021-06-25 NOTE — Progress Notes (Signed)
  NEUROSURGERY PROGRESS NOTE   Pt seen and examined. No issues overnight. Remains with increase O2 requirement.  EXAM: Temp:  [98.2 F (36.8 C)-101.4 F (38.6 C)] 100.1 F (37.8 C) (12/09 0800) Pulse Rate:  [101-124] 115 (12/09 0900) Resp:  [10-30] 30 (12/09 0900) BP: (119-148)/(61-85) 148/80 (12/09 0900) SpO2:  [88 %-100 %] 100 % (12/09 0900) Arterial Line BP: (118-168)/(60-96) 161/96 (12/09 0900) FiO2 (%):  [50 %-100 %] 60 % (12/09 0752) Intake/Output      12/08 0701 12/09 0700 12/09 0701 12/10 0700   I.V. (mL/kg) 2619.5 (41.3) 146.6 (2.3)   NG/GT 1770 130   IV Piggyback 99.9    Total Intake(mL/kg) 4489.3 (70.7) 276.6 (4.4)   Urine (mL/kg/hr) 1500 (1) 1975 (8.9)   Drains 193 14   Stool 200 0   Total Output 1893 1989   Net +2596.3 -1712.4        Urine Occurrence 1 x 1 x   Stool Occurrence  1 x    Currently Eyes open to stim, grimaces to pain Eyes conjugate, Pupils 29mm reactive Breathing over vent FC BLE by wiggling toes  ?extensor responses BUE  LABS: Lab Results  Component Value Date   CREATININE 0.77 06/25/2021   BUN 36 (H) 06/25/2021   NA 151 (H) 06/25/2021   K 4.1 06/25/2021   CL 117 (H) 06/25/2021   CO2 24 06/25/2021   Lab Results  Component Value Date   WBC 14.8 (H) 06/25/2021   HGB 9.9 (L) 06/25/2021   HCT 29.8 (L) 06/25/2021   MCV 85.9 06/25/2021   PLT 303 06/25/2021   TCD:   Date POD PCO2 HCT BP   MCA ACA PCA OPHT SIPH VERT Basilar  12/5 MES         Right  Left   69 50 -63  -36   48  26   23  24    43  40   *  *   *        12/7 RH          Right  Left    110   102    -33   -65    56   60    22   23    *   *    -15   -27    *      IMPRESSION: - 35 y.o. 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 Neurologic exam with UE worse than LE and TCD trending up in bilateral MCA suggest possible symptomatic MCA spasm. - CTA chest (+) small subsegmental RUL PE and severe bilateral  pneumonia/atelectasis - Fluid overload on exam with (+)12L over hospitalization  PLAN: - Cont to monitor neurologic exam, hold sedation if possible from vent standpoint - Repeat TCD today. If MCA velocities cont to be increased with get CTA to confirm spasm and then start hyperdynamic therapy, attempt to increase SBP >180 with pressors. May be difficult with pt being already fluid overloaded. - Vent mgmt per PCCM - Spoke with PCCM, do not feel need for anticoagulation at this point. Will check Duplex for DVT, may need IVC filter - Cont abx for pneumonia - Cont Nimotop - Cont ASA/Brilinta   31, MD [facility]

## 2021-06-25 NOTE — Progress Notes (Signed)
OT Cancellation Note  Patient Details Name: Kelly Gillespie MRN: [id number] DOB: 1985-07-21   Cancelled Treatment:    Reason Eval/Treat Not Completed: Patient not medically ready.  Patient with multiple tests this date, OT to continue efforts as appropriate.    Jhonnie Aliano D Mariaguadalupe Fialkowski 06/25/2021, 9:13 AM

## 2021-06-25 NOTE — Progress Notes (Signed)
   06/25/21 2217  Provider Notification  Provider Name/Title Docia Barrier NP  Date Provider Notified 06/25/21  Time Provider Notified 2217  Notification Type Call (Page to neurosurgery, provider came to bedside)  Notification Reason Other (Comment) (SBP clarification)  Provider response Other (Comment) (Monitor SBP as close to 180 as possible. If patient shows signs of vasospasm reach out to [facility] for order for pressor)  Date of Provider Response 06/25/21  Time of Provider Response 2300   RN needing clarification on SBP parameters, Aline reading 190's. Per Dr. Val Riles note 06/25/2021, patient may have a possible vasospasm and consider SBP >180 with pressor use. Conflicting orders for SBP to remain below 180. RN unsure to treat SBP with PRN to keep goal below 180 or let SBP stay in the 190's. Reached out to PCCM for possible PRN, and they suggested confirming with neurosurgery. Neurosurgery is alright with SBP close to 180, currently in thew 150's and 160's, no new orders at this time. If patient begins to show altered neuro assessment or other signs of vasospasm, RN should reach out to [facility] for pressors.

## 2021-06-25 NOTE — Progress Notes (Signed)
SLP Cancellation Note  Patient Details Name: Kelly Gillespie MRN: [id number] DOB: [birth year]   Cancelled treatment:       Reason Eval/Treat Not Completed: Other (comment). Pt remains vented. SLP will sign off and await new orders   Kaylani Fromme, Riley Nearing 06/25/2021, 7:41 AM

## 2021-06-25 NOTE — Progress Notes (Signed)
eLink Physician-Brief Progress Note Patient Name: ICHELLE HARRAL DOB: [birth year] MRN: [id number]   Date of Service  06/25/2021  HPI/Events of Note  RN reports erythema to both lower extremities  eICU Interventions  Pt has Pe's.  Already waiting on LE venous dopplers.  Order placed yesterday afternoon.     Intervention Category Minor Interventions: Other:  Jacinta Shoe 06/25/2021, 5:38 AM

## 2021-06-25 NOTE — Progress Notes (Signed)
Transcranial Doppler   Date POD PCO2 HCT BP   MCA ACA PCA OPHT SIPH VERT Basilar  12/5 MES         Right  Left   69 50 -63  -36   48  26   23  24    43  40   *  *   *       12/7 RH          Right  Left    110   102    -33   -65    56   60    22   23    *   *    -15   -27    *       12/9 MES          Right  Left    1858       -25   -38    67   43    34   24    47   48    -84   -77    -152                   Right  Left                                                                 Right  Left                                                               Right  Left                                                               Right  Left                                                       MCA = Middle Cerebral Artery      OPHT = Opthalmic Artery     BASILAR = Basilar Artery   ACA = Anterior Cerebral Artery     SIPH = Carotid Siphon PCA = Posterior Cerebral Artery   VERT = Verterbral Artery                    Normal MCA = 62+\-12 ACA = 50+\-12 PCA = 42+\-23   Rt lindegaard ratio: 7.4 Lt lindgaard ratio: 1.4  Star Resler Missi Mcmackin 06/25/2021 10:53 AM

## 2021-06-25 NOTE — Progress Notes (Signed)
NAME:  Kelly Gillespie, MRN:  [id number], DOB:  20-Aug-1985, LOS: 6 ADMISSION DATE:  07/12/2021, CONSULTATION DATE:  07/05/2021 REFERRING MD:  Dr. Conchita Paris , CHIEF COMPLAINT:  Rupture cerebral aneurysm    History of Present Illness:  35 yo female presented with headache, vomiting and then AMS.  Noted to have Lt side weakness in ER and asymmetric pupils with SBP 220.  CT head showed aneurysmal bleeding with large volume SAH.  Had EVD placed by neurosurgery and then had cerebral angiogram showing wide neck Rt P1 aneurysm.  She required intubation for airway protection.  PCCM asked to assist with management in ICU.  Pertinent  Medical History  DM type 2, Hypothyroidism, Seizures, Depression, Migraine headaches  Significant Hospital Events: Including procedures, antibiotic start and stop dates in addition to other pertinent events   12/3 Admitted after being found with AMS and decreased LOC. Code stroke called, head CT positive for [facility] 12/4  Pipeline embolization of right PCA aneurysm 12/5 follows commands on sedation interruption and but unable to wean due to hypoxia.  12/6 treatment for pneumonia started.  12/7 improving oxygenation but still failed SBT. Central line removed.  12/8 CTPE study shows aspiration pna and a PE.   Interim History / Subjective:  This morning mother and aunt at bedside. Notes worsening lower extremity edema and redness. CTPE study yesterday show small segmental pulmonary embolism in the right upper lobe with bilateral aspiration pna. Desaturation overnight requiring increase in vent support Objective   Blood pressure (!) 148/80, pulse (!) 115, temperature 100.1 F (37.8 C), temperature source Oral, resp. rate (!) 30, height 5\' 2"  (1.575 m), weight 63.5 kg, SpO2 100 %.    Vent Mode: PRVC FiO2 (%):  [50 %-100 %] 60 % Set Rate:  [14 bmp] 14 bmp Vt Set:  [400 mL] 400 mL PEEP:  [5 cmH20] 5 cmH20 Pressure Support:  [10 cmH20-12 cmH20] 10 cmH20 Plateau Pressure:  [14  cmH20-15 cmH20] 14 cmH20   Intake/Output Summary (Last 24 hours) at 06/25/2021 1005 Last data filed at 06/25/2021 0900 Gross per 24 hour  Intake 4253.58 ml  Output 1890 ml  Net 2363.58 ml   Filed Weights   06/22/21 0500 06/23/21 0500 06/24/21 0500  Weight: 56.3 kg 59.3 kg 63.5 kg    Examination:  General - intubated,  Eyes - PEERL ENT - ETT to vent Cardiac - RRR no mrg Chest - diminished in basis, no wheezes or crackles  Abdomen - soft, non tender, + bowel sounds Extremities - upper and lower extremity edema, knees and calves are erythematous and edematous Neuro - withdraws to pain  Ancillary tests personally reviewed.   Sputum with MSSA Na 151 Net fluid balance +12L  Assessment & Plan:   Critically ill due to acute Hunt-Hess 4 , Fischer 4 aneurysmal SAH requiring mechanical ventilation for airway protection.  Acute hypoxic respiratory failure requiring mechanical ventilation due to RLL aspiration.  Acute subsegmental pulmonary embolism MSSA PNA - she is volume overloaded 12L positive and this is a barrier to extubation. Stop normal saline infusion. - diurese with IV lasix - continue ceftriaxone for MSSA PNA.  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 Hx of ADD/depression. -Continue continue home fluoxetine, Wellbutrin,  Synthroid and Adderall.  Lower extremity edema - stat lower extremity dopplers - may need IVC filter if positive  Discussed with Dr. Conchita Paris  Best Practice (right click and "Reselect all SmartList Selections" daily)   Diet/type: tubefeeds  DVT prophylaxis: heparin [place] and SCD GI prophylaxis: PPI Lines: Arterial Line Foley:  Yes, and it is still  needed Code Status:  full code Last date of multidisciplinary goals of care discussion: mother and aunt updated at bedside 12/9  CRITICAL CARE The patient is critically ill due to respiratory failure, intracranial hemorrhage.  Critical care was necessary to treat or prevent imminent or life-threatening deterioration.  Critical care was time spent personally by me on the following activities: development of treatment plan with patient and/or surrogate as well as nursing, discussions with consultants, evaluation of patient's response to treatment, examination of patient, obtaining history from patient or surrogate, ordering and performing treatments and interventions, ordering and review of laboratory studies, ordering and review of radiographic studies, pulse oximetry, re-evaluation of patient's condition and participation in multidisciplinary rounds.   Critical Care Time devoted to patient care services described in this note is 47 minutes. This time reflects time of care of this signee Charlott Holler . This critical care time does not reflect separately billable procedures or procedure time, teaching time or supervisory time of PA/NP/Med student/Med Resident etc but could involve care discussion time.       Charlott Holler [facility] 06/25/2021 10:05 AM  Pager: see AMION  If no response to pager , please call critical care on call (see AMION) until 7pm After 7:00 pm call Elink

## 2021-06-26 ENCOUNTER — Inpatient Hospital Stay (HOSPITAL_COMMUNITY): Payer: BC Managed Care – PPO

## 2021-06-26 DIAGNOSIS — I609 Nontraumatic subarachnoid hemorrhage, unspecified: Secondary | ICD-10-CM | POA: Diagnosis not present

## 2021-06-26 LAB — CBC WITH DIFFERENTIAL/PLATELET
Abs Immature Granulocytes: 0.66 10*3/uL — ABNORMAL HIGH (ref 0.00–0.07)
Basophils Absolute: 0.1 10*3/uL (ref 0.0–0.1)
Basophils Relative: 1 %
Eosinophils Absolute: 0 10*3/uL (ref 0.0–0.5)
Eosinophils Relative: 0 %
HCT: 32.7 % — ABNORMAL LOW (ref 36.0–46.0)
Hemoglobin: 10.7 g/dL — ABNORMAL LOW (ref 12.0–15.0)
Immature Granulocytes: 5 %
Lymphocytes Relative: 7 %
Lymphs Abs: 1 10*3/uL (ref 0.7–4.0)
MCH: 27.7 pg (ref 26.0–34.0)
MCHC: 32.7 g/dL (ref 30.0–36.0)
MCV: 84.7 fL (ref 80.0–100.0)
Monocytes Absolute: 1 10*3/uL (ref 0.1–1.0)
Monocytes Relative: 7 %
Neutro Abs: 11.8 10*3/uL — ABNORMAL HIGH (ref 1.7–7.7)
Neutrophils Relative %: 80 %
Platelets: 361 10*3/uL (ref 150–400)
RBC: 3.86 MIL/uL — ABNORMAL LOW (ref 3.87–5.11)
RDW: 14.6 % (ref 11.5–15.5)
WBC: 14.5 10*3/uL — ABNORMAL HIGH (ref 4.0–10.5)
nRBC: 0.8 % — ABNORMAL HIGH (ref 0.0–0.2)

## 2021-06-26 LAB — GLUCOSE, CAPILLARY
Glucose-Capillary: 125 mg/dL — ABNORMAL HIGH (ref 70–99)
Glucose-Capillary: 227 mg/dL — ABNORMAL HIGH (ref 70–99)
Glucose-Capillary: 126 mg/dL — ABNORMAL HIGH (ref 70–99)
Glucose-Capillary: 198 mg/dL — ABNORMAL HIGH (ref 70–99)
Glucose-Capillary: 33 mg/dL — CL (ref 70–99)
Glucose-Capillary: 216 mg/dL — ABNORMAL HIGH (ref 70–99)
Glucose-Capillary: 30 mg/dL — CL (ref 70–99)
Glucose-Capillary: 253 mg/dL — ABNORMAL HIGH (ref 70–99)

## 2021-06-26 LAB — BASIC METABOLIC PANEL
Anion gap: 12 (ref 5–15)
Anion gap: 10 (ref 5–15)
BUN: 24 mg/dL — ABNORMAL HIGH (ref 6–20)
BUN: 27 mg/dL — ABNORMAL HIGH (ref 6–20)
CO2: 28 mmol/L (ref 22–32)
CO2: 31 mmol/L (ref 22–32)
Calcium: 8.4 mg/dL — ABNORMAL LOW (ref 8.9–10.3)
Calcium: 8.2 mg/dL — ABNORMAL LOW (ref 8.9–10.3)
Chloride: 111 mmol/L (ref 98–111)
Chloride: 109 mmol/L (ref 98–111)
Creatinine, Ser: 0.79 mg/dL (ref 0.44–1.00)
Creatinine, Ser: 1.02 mg/dL — ABNORMAL HIGH (ref 0.44–1.00)
GFR, Estimated: >60 mL/min (ref >60)
GFR, Estimated: >60 mL/min (ref >60)
Glucose, Bld: 171 mg/dL — ABNORMAL HIGH (ref 70–99)
Glucose, Bld: 218 mg/dL — ABNORMAL HIGH (ref 70–99)
Potassium: 3.2 mmol/L — ABNORMAL LOW (ref 3.5–5.1)
Potassium: 3.7 mmol/L (ref 3.5–5.1)
Sodium: 151 mmol/L — ABNORMAL HIGH (ref 135–145)
Sodium: 150 mmol/L — ABNORMAL HIGH (ref 135–145)

## 2021-06-26 LAB — GLUCOSE, RANDOM: Glucose, Bld: 334 mg/dL — ABNORMAL HIGH (ref 70–99)

## 2021-06-26 LAB — TRIGLYCERIDES: Triglycerides: 205 mg/dL — ABNORMAL HIGH (ref <150)

## 2021-06-26 MED ORDER — VASOPRESSIN 20 UNITS/100 ML INFUSION FOR SHOCK
0.0000 [IU]/min | INTRAVENOUS | Status: DC
  Administered 2021-06-26 – 2021-06-28 (×4): 0.03 [IU]/min via INTRAVENOUS
  Administered 2021-06-28: 0.02 [IU]/min via INTRAVENOUS
  Filled 2021-06-26 (×5): qty 100

## 2021-06-26 MED ORDER — SODIUM CHLORIDE 0.9 % IV SOLN: 250.0000 mL | INTRAVENOUS | Status: DC

## 2021-06-26 MED ORDER — BETHANECHOL CHLORIDE 10 MG PO TABS
5.0000 mg | ORAL_TABLET | Freq: Three times a day (TID) | ORAL | Status: DC
Start: 2021-06-26 — End: 2021-06-28
  Administered 2021-06-26 – 2021-06-27 (×5): 5 mg
  Filled 2021-06-26 (×5): qty 1

## 2021-06-26 MED ORDER — PHENYLEPHRINE 40 MCG/ML (10ML) SYRINGE FOR IV PUSH (FOR BLOOD PRESSURE SUPPORT)
PREFILLED_SYRINGE | INTRAVENOUS | Status: AC
  Filled 2021-06-26: qty 10

## 2021-06-26 MED ORDER — POTASSIUM CHLORIDE CRYS ER 20 MEQ PO TBCR: 40.0000 meq | EXTENDED_RELEASE_TABLET | Freq: Once | ORAL | Status: DC

## 2021-06-26 MED ORDER — NOREPINEPHRINE 4 MG/250ML-% IV SOLN
0.0000 ug/min | INTRAVENOUS | Status: DC
Start: 2021-06-26 — End: 2021-06-26

## 2021-06-26 MED ORDER — NOREPINEPHRINE 4 MG/250ML-% IV SOLN
0.0000 ug/min | INTRAVENOUS | Status: DC
  Administered 2021-06-26: 2 ug/min via INTRAVENOUS
  Filled 2021-06-26 (×2): qty 250

## 2021-06-26 MED ORDER — NOREPINEPHRINE 16 MG/250ML-% IV SOLN
0.0000 ug/min | INTRAVENOUS | Status: DC
  Administered 2021-06-26: 16:00:00 40 ug/min via INTRAVENOUS
  Administered 2021-06-27: 50 ug/min via INTRAVENOUS
  Administered 2021-06-27: 35 ug/min via INTRAVENOUS
  Administered 2021-06-27 (×2): 50 ug/min via INTRAVENOUS
  Administered 2021-06-28: 9 ug/min via INTRAVENOUS
  Administered 2021-06-28: 50 ug/min via INTRAVENOUS
  Filled 2021-06-26 (×7): qty 250

## 2021-06-26 MED ORDER — NOREPINEPHRINE 16 MG/250ML-% IV SOLN: 0.0000 ug/min | INTRAVENOUS | Status: DC

## 2021-06-26 MED ORDER — POTASSIUM CHLORIDE 20 MEQ PO PACK
40.0000 meq | PACK | Freq: Once | ORAL | Status: AC
Start: 2021-06-26 — End: 2021-06-26
  Administered 2021-06-26: 40 meq
  Filled 2021-06-26: qty 2

## 2021-06-26 MED ORDER — IOHEXOL 350 MG/ML SOLN
100.0000 mL | Freq: Once | INTRAVENOUS | Status: AC | PRN
  Administered 2021-06-26: 100 mL via INTRAVENOUS

## 2021-06-26 MED ORDER — DEXTROSE 50 % IV SOLN
INTRAVENOUS | Status: AC
  Administered 2021-06-26: 50 mL
  Filled 2021-06-26: qty 50

## 2021-06-26 NOTE — Progress Notes (Signed)
Elevated HR sustaining in the 150 and 160, Patient is tachypneic with respirations at 40. Pupils are now unequal with left pupil enlarged to 8 and extremely sluggish. Patient now flickers to pain and unable to follow commands. RN called PCCM to get a stat head CT. RN paged neurosurgery, neurosurgery aware of these changes and will be at the bedside first thing in the morning.

## 2021-06-26 NOTE — Progress Notes (Signed)
   Providing Compassionate, Quality Care - Together  NEUROSURGERY PROGRESS NOTE   S: Patient remains intubated, requiring slightly more sedation due to tachypnea/tachycardia  O: EXAM:  BP (!) 172/95   Pulse (!) 118   Temp (!) 103.9 F (39.9 C) (Axillary) Comment: RN notified  Resp (!) 30   Ht 5\' 2"  (1.575 m)   Wt 58.6 kg   SpO2 95%   BMI 23.63 kg/m   Intubated, sedated Eyes closed to pain PERRLA, slightly right preferred gaze Breathing over the vent Extensor posturing bilateral upper extremity Bilateral lower extremity no motor response to pain Right frontal EVD in place, intermittently draining  ASSESSMENT:  35 y.o. female with   SAH D7, POD6 pipeline embo of right P1 aneurysm Cerebral vasospasm  PLAN: -TCD's show elevation of MCA velocities consistent with moderate vasospasm -We will continue triple H therapy, goal SBP greater than 160, greater than 180 would be most ideal however significant fluid overload making this somewhat challenging -Continue supportive care -Duplex negative for DVTs -Continue hematoma -Continue aspirin and Brilinta -CT brain this a.m. reviewed, increased right lateral ventricle intraventricular hemorrhage, otherwise appears stable. -Will continue to flush extraventricular drain as needed -Updated the family at bedside   Thank you for allowing me to participate in this patient's care.  Please do not hesitate to call with questions or concerns.   31, DO Neurosurgeon [facility] Cell: [phone]

## 2021-06-26 NOTE — Progress Notes (Signed)
RT transported pt to CT. No complications and vitals are stable. RT will monitor as needed. 

## 2021-06-26 NOTE — Procedures (Signed)
Central Venous Catheter Insertion Procedure Note  Kelly Gillespie  [id number]  [birth year]  Date:06/26/21  Time:1:08 PM   Provider Performing:Revella Shelton Salena Saner Katrinka Blazing   Procedure: Insertion of Non-tunneled Central Venous [phone]) with US guidance ([id number])   Indication(s) Medication administration  Consent Risks of the procedure as well as the alternatives and risks of each were explained to the patient and/or caregiver.  Consent for the procedure was obtained and is signed in the bedside chart  Anesthesia Topical only with 1% lidocaine   Timeout Verified patient identification, verified procedure, site/side was marked, verified correct patient position, special equipment/implants available, medications/allergies/relevant history reviewed, required imaging and test results available.  Sterile Technique Maximal sterile technique including full sterile barrier drape, hand hygiene, sterile gown, sterile gloves, mask, hair covering, sterile ultrasound probe cover (if used).  Procedure Description Area of catheter insertion was cleaned with chlorhexidine and draped in sterile fashion.  With real-time ultrasound guidance a central venous catheter was placed into the left internal jugular vein. Nonpulsatile blood flow and easy flushing noted in all ports.  The catheter was sutured in place and sterile dressing applied.  Complications/Tolerance None; patient tolerated the procedure well. Chest X-ray is ordered to verify placement for internal jugular or subclavian cannulation.   Chest x-ray is not ordered for femoral cannulation.  EBL Minimal  Specimen(s) None

## 2021-06-26 NOTE — Progress Notes (Signed)
NAME:  Kelly Gillespie, MRN:  [id number], DOB:  Feb 03, 1986, LOS: 7 ADMISSION DATE:  07/04/2021, CONSULTATION DATE:  06/23/2021 REFERRING MD:  Dr. Conchita Paris , CHIEF COMPLAINT:  Rupture cerebral aneurysm    History of Present Illness:  35 yo female presented with headache, vomiting and then AMS.  Noted to have Lt side weakness in ER and asymmetric pupils with SBP 220.  CT head showed aneurysmal bleeding with large volume SAH.  Had EVD placed by neurosurgery and then had cerebral angiogram showing wide neck Rt P1 aneurysm.  She required intubation for airway protection.  PCCM asked to assist with management in ICU.  Pertinent  Medical History  DM type 2, Hypothyroidism, Seizures, Depression, Migraine headaches  Significant Hospital Events: Including procedures, antibiotic start and stop dates in addition to other pertinent events   12/3 Admitted after being found with AMS and decreased LOC. Code stroke called, head CT positive for [facility] 12/4  Pipeline embolization of right PCA aneurysm 12/5 follows commands on sedation interruption and but unable to wean due to hypoxia.  12/6 treatment for pneumonia started.  12/7 improving oxygenation but still failed SBT. Central line removed.  12/8 CTPE study shows aspiration pna and a PE.   Interim History / Subjective:  urrently asynchronous with the vent sedation increased.  Interventricular drain not functioning properly neurosurgery aware Objective   Blood pressure (!) 172/95, pulse (!) 118, temperature (!) 103.9 F (39.9 C), temperature source Axillary, resp. rate (!) 30, height 5\' 2"  (1.575 m), weight 58.6 kg, SpO2 95 %.    Vent Mode: PRVC FiO2 (%):  [40 %] 40 % Set Rate:  [14 bmp] 14 bmp Vt Set:  [400 mL] 400 mL PEEP:  [5 cmH20] 5 cmH20 Plateau Pressure:  [14 cmH20] 14 cmH20   Intake/Output Summary (Last 24 hours) at 06/26/2021 0916 Last data filed at 06/26/2021 0900 Gross per 24 hour  Intake 2358.31 ml  Output 14/04/2021 ml  Net -8881.69 ml    Filed Weights   06/23/21 0500 06/24/21 0500 06/26/21 0500  Weight: 59.3 kg 63.5 kg 58.6 kg    Examination:  General: Middle-age female who is obviously out of sync with ventilator and uncomfortable HEENT: Endotracheal tube is in place gastric tube is in place interventricular drain is noted and not draining Neuro: Extends to noxious stimuli CV: Sinus tachycardia 122 PULM: Decreased breath in the bases  GI: soft, bsx4 active  GU: Amber urine Extremities: warm dry, 2+ lower extremity c edema  Skin: no rashes or lesions   Ancillary tests personally reviewed.    Intake/Output Summary (Last 24 hours) at 06/26/2021 0920 Last data filed at 06/26/2021 0900 Gross per 24 hour  Intake 2358.31 ml  Output 14/04/2021 ml  Net -8881.69 ml  Weight change:   Recent Labs  Lab 06/24/21 0427 06/25/21 0304 06/26/21 0436  NA 146* 151* 151*  K 4.2 4.1 3.2*  CL 117* 117* 111  CO2 22 24 28   BUN 33* 36* 24*  CREATININE 0.80 0.77 0.79  GLUCOSE 261* 114* 171*   Recent Labs  Lab 06/24/21 0427 06/25/21 0304 06/26/21 0436  HGB 9.7* 9.9* 10.7*  HCT 29.6* 29.8* 32.7*  WBC 17.0* 14.8* 14.5*  PLT 288 303 361       Assessment & Plan:   Critically ill due to acute Hunt-Hess 4 , Fischer 4 aneurysmal SAH requiring mechanical ventilation for airway protection.  Acute hypoxic respiratory failure requiring mechanical ventilation due to RLL aspiration.  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 Hx of ADD/depression. Continue home medications fluoxetine, Wellbutrin, Synthroid and Adderall  Lower extremity  edema Lower extremity ultrasound 06/25/2021 negative Continue to monitor  Discussed with Dr. Gertie Exon [facility] (right click and "Reselect all SmartList Selections" daily)   Diet/type: tubefeeds  DVT prophylaxis: heparin [place] and SCD GI prophylaxis: PPI Lines: Arterial Line Foley:  Yes, and it is still needed Code Status:  full code Last date of multidisciplinary goals of care discussion: mother and aunt updated at bedside 06/26/21  CRITICAL CARE 45 min  Brett Canales Yon Schiffman ACNP Acute Care Nurse Practitioner [facility] Please consult Amion 06/26/2021, 9:16 AM

## 2021-06-26 NOTE — Progress Notes (Signed)
Docia Barrier NP called to bedside d/t EVD not draining, flushed for second time this am and still not draining much.  Levophed gtt started to keep SBP >160 per neurosurgery.  Bilateral pupils changed from 4 and sluggish to 4 and nonreactive at this time.  Docia Barrier NP aware.  CT head ordered.

## 2021-06-26 NOTE — Progress Notes (Addendum)
Notified by the patient's nurse that upon return from CT angiogram, the left pupil was approximately 5 to 6 mm and nonreactive.  CT angiogram was reviewed by myself and discussed with the radiologist.  Shows diffuse severe cerebral vasospasm throughout, worse in the posterior circulation.  There is persistent but stable hydrocephalus compared to prior CT imaging.  Basal cisterns remain patent therefore I believe the neurologic changes not directly related to intracranial pressure.  Ventriculostomy was able to be flushed multiple times today.  CT also showed new areas of cytotoxic edema particularly along the right MCA distribution therefore I believe the decline in neurologic status is related to severe vasospasm and has led to significant strokes.  At this time, currently her neurologic exam remains poor.  Bilateral pupils are fixed and dilated at 7 mm.  She has no motor response to pain bilateral upper and lower extremities.  I explained the findings of the CT scan to the family at bedside as well as her aunt over the phone.  I recommended family come visit the patient as she has made a significant neurologic decline and likely an unrecoverable stroke.  Answered all her questions.  We will continue supportive care and aggressive management.   Thank you for allowing me to participate in this patient's care.  Please do not hesitate to call with questions or concerns.   Monia Pouch, DO Neurosurgeon [facility]

## 2021-06-26 NOTE — Progress Notes (Signed)
Dr. Jake Samples gave verbal order to keep SBP >180, Levophed at max of 10 mcg/min. running through PIV and SBP in the 160s per art line.  Dr. Delton Coombes called and notified.  Awating orders.

## 2021-06-26 NOTE — Progress Notes (Signed)
eLink Physician-Brief Progress Note Patient Name: Kelly Gillespie DOB: 1985-11-25 MRN: [id number]   Date of Service  06/26/2021  HPI/Events of Note  Acute change in pupil exam, bp, and HR  eICU Interventions  Stat head ct     Intervention Category Major Interventions: Change in mental status - evaluation and management  Henry Russel, P 06/26/2021, 5:08 AM

## 2021-06-26 NOTE — Progress Notes (Signed)
Pt transported to CT and back to 4N 16 on full vent support. No complications noted.

## 2021-06-27 ENCOUNTER — Inpatient Hospital Stay (HOSPITAL_COMMUNITY): Payer: BC Managed Care – PPO

## 2021-06-27 DIAGNOSIS — I609 Nontraumatic subarachnoid hemorrhage, unspecified: Secondary | ICD-10-CM | POA: Diagnosis not present

## 2021-06-27 DIAGNOSIS — Z515 Encounter for palliative care: Secondary | ICD-10-CM

## 2021-06-27 LAB — CBC WITH DIFFERENTIAL/PLATELET
Abs Immature Granulocytes: 0.26 10*3/uL — ABNORMAL HIGH (ref 0.00–0.07)
Basophils Absolute: 0.1 10*3/uL (ref 0.0–0.1)
Basophils Relative: 0 %
Eosinophils Absolute: 0 10*3/uL (ref 0.0–0.5)
Eosinophils Relative: 0 %
HCT: 34.8 % — ABNORMAL LOW (ref 36.0–46.0)
Hemoglobin: 10.8 g/dL — ABNORMAL LOW (ref 12.0–15.0)
Immature Granulocytes: 1 %
Lymphocytes Relative: 14 %
Lymphs Abs: 3 10*3/uL (ref 0.7–4.0)
MCH: 28.1 pg (ref 26.0–34.0)
MCHC: 31 g/dL (ref 30.0–36.0)
MCV: 90.4 fL (ref 80.0–100.0)
Monocytes Absolute: 1.4 10*3/uL — ABNORMAL HIGH (ref 0.1–1.0)
Monocytes Relative: 6 %
Neutro Abs: 17.4 10*3/uL — ABNORMAL HIGH (ref 1.7–7.7)
Neutrophils Relative %: 79 %
Platelets: 351 10*3/uL (ref 150–400)
RBC: 3.85 MIL/uL — ABNORMAL LOW (ref 3.87–5.11)
RDW: 14.7 % (ref 11.5–15.5)
WBC: 22.2 10*3/uL — ABNORMAL HIGH (ref 4.0–10.5)
nRBC: 0.3 % — ABNORMAL HIGH (ref 0.0–0.2)

## 2021-06-27 LAB — GLUCOSE, CAPILLARY
Glucose-Capillary: 248 mg/dL — ABNORMAL HIGH (ref 70–99)
Glucose-Capillary: 103 mg/dL — ABNORMAL HIGH (ref 70–99)
Glucose-Capillary: 140 mg/dL — ABNORMAL HIGH (ref 70–99)
Glucose-Capillary: 256 mg/dL — ABNORMAL HIGH (ref 70–99)
Glucose-Capillary: 276 mg/dL — ABNORMAL HIGH (ref 70–99)
Glucose-Capillary: 178 mg/dL — ABNORMAL HIGH (ref 70–99)
Glucose-Capillary: 153 mg/dL — ABNORMAL HIGH (ref 70–99)
Glucose-Capillary: 384 mg/dL — ABNORMAL HIGH (ref 70–99)

## 2021-06-27 LAB — MAGNESIUM: Magnesium: 2.3 mg/dL (ref 1.7–2.4)

## 2021-06-27 LAB — BASIC METABOLIC PANEL
Anion gap: 6 (ref 5–15)
BUN: 24 mg/dL — ABNORMAL HIGH (ref 6–20)
CO2: 36 mmol/L — ABNORMAL HIGH (ref 22–32)
Calcium: 8.5 mg/dL — ABNORMAL LOW (ref 8.9–10.3)
Chloride: 107 mmol/L (ref 98–111)
Creatinine, Ser: 0.73 mg/dL (ref 0.44–1.00)
GFR, Estimated: >60 mL/min (ref >60)
Glucose, Bld: 112 mg/dL — ABNORMAL HIGH (ref 70–99)
Potassium: 3.1 mmol/L — ABNORMAL LOW (ref 3.5–5.1)
Sodium: 149 mmol/L — ABNORMAL HIGH (ref 135–145)

## 2021-06-27 LAB — PHOSPHORUS: Phosphorus: 2.1 mg/dL — ABNORMAL LOW (ref 2.5–4.6)

## 2021-06-27 MED ORDER — DEXTROSE-NACL 5-0.45 % IV SOLN: INTRAVENOUS | Status: DC

## 2021-06-27 MED ORDER — POTASSIUM CHLORIDE 10 MEQ/50ML IV SOLN
10.0000 meq | INTRAVENOUS | Status: AC
  Administered 2021-06-27 (×5): 10 meq via INTRAVENOUS
  Filled 2021-06-27 (×6): qty 50

## 2021-06-27 MED ORDER — AMIODARONE IV BOLUS ONLY 150 MG/100ML
150.0000 mg | Freq: Once | INTRAVENOUS | Status: AC
  Administered 2021-06-27: 150 mg via INTRAVENOUS
  Filled 2021-06-27: qty 100

## 2021-06-27 NOTE — Progress Notes (Signed)
   06/27/21 1125  Clinical Encounter Type  Visited With Patient and family together;Health care provider  Visit Type Follow-up;Spiritual support;Critical Care   Chaplain followed up with patient's mother and let her know that I left a voicemail with [facility] and will follow up with them this after. Mother expressed disappointment in the change in visitation for their situation, with limiting the amount of people that could come see the patient. I relayed this information to the patient's RN, who was going to speak to the charge nurse about it.  Spiritual care services available as needed.   Alda Ponder, Chaplain

## 2021-06-27 NOTE — Progress Notes (Signed)
   Providing Compassionate, Quality Care - Together  NEUROSURGERY PROGRESS NOTE   S: neuro exam remains poor  O: EXAM:  BP 124/82 (BP Location: Left Arm)   Pulse (!) 130   Temp 100.2 F (37.9 C) (Axillary)   Resp 14   Ht 5\' 2"  (1.575 m)   Wt 58.6 kg   SpO2 99%   BMI 23.63 kg/m   Intubated, not sedated Eyes closed to pain Bilateral pupils 7 mm, nonreactive No corneal reflex bilaterally No cough/gag No doll's eyes reflex No motor response to pain bilaterally  ASSESSMENT:  35 y.o. female with   SAH D8, POD7 pipeline embo of right P1 aneurysm Cerebral vasospasm, severe   PLAN: -CT angiogram yesterday showed severe diffuse cerebral vasospasm with evidence of infarct. -Patient had significant neurologic decline after CT angiogram, and remains to have a poor neurologic exam with loss of brainstem reflexes -Family updated at bedside, father currently on his way here from out of state -Palliative consulted    Thank you for allowing me to participate in this patient's care.  Please do not hesitate to call with questions or concerns.   31, DO Neurosurgeon [facility] Cell: [phone]

## 2021-06-27 NOTE — Progress Notes (Signed)
   06/27/21 1026  Clinical Encounter Type  Visited With Patient and family together  Visit Type Initial;Spiritual support;Critical Care  Referral From Family  Consult/Referral To Faith community  Spiritual Encounters  Spiritual Needs Ritual    Chaplain was requested by the family. They are hoping to receive anointing of the sick for their daughter. Mom, Misty Stanley, and aunt, Toniann Fail, are members of [facility] [facility] [employer] [facility]. I have called and left a message on their emergency line, and will seek to follow up with them as well. Chaplain introduced spiritual care services. Spiritual care services available as needed.   Alda Ponder, Chaplain

## 2021-06-27 NOTE — Progress Notes (Addendum)
eLink Physician-Brief Progress Note Patient Name: Kelly Gillespie DOB: January 13, 1986 MRN: [id number]   Date of Service  06/27/2021  HPI/Events of Note  Contacted by RN regarding [employer] orders.  Reported patient scheduled for organ donation followed by extubation tomorrow 07/07/2021.  Day notes reviewed including palliative with plan for extubation to comfort care  eICU Interventions  Labs and imaging ordered per organ donation request  Per bedside RN, Primary team (Dr. Doran Durand) aware and agrees to plan. OR procurement scheduled on 07/08/23 at 2000 after cardiac death.        Niccolo Burggraf Mechele Collin 06/27/2021, 11:01 PM

## 2021-06-27 NOTE — Progress Notes (Signed)
HR tachy in the 140s-150s. Notified eLink and 12-lead EKG obtained. Neuro status unchanged. Neurosurgery paged and notified of HR. Goal SBP 170s-180s per neurosurgery.  0030: HR still in 140s; order for amiodarone bolus obtained per eLink MD (see MAR).  0315: SBP in 140s-150s despite max dose of Levophed (40 mcg/min). eLink notified. Order obtained per eLink MD to titrate Levophed up to 50 mcg/min to maintain SBP 170s-180s.

## 2021-06-27 NOTE — Progress Notes (Signed)
NAME:  Kelly Gillespie, MRN:  [id number], DOB:  June 17, 1986, LOS: 8 ADMISSION DATE:  07/06/2021, CONSULTATION DATE:  07/06/2021 REFERRING MD:  Dr. Conchita Paris , CHIEF COMPLAINT:  Rupture cerebral aneurysm    History of Present Illness:  35 yo female presented with headache, vomiting and then AMS.  Noted to have Lt side weakness in ER and asymmetric pupils with SBP 220.  CT head showed aneurysmal bleeding with large volume SAH.  Had EVD placed by neurosurgery and then had cerebral angiogram showing wide neck Rt P1 aneurysm.  She required intubation for airway protection.  PCCM asked to assist with management in ICU.  Pertinent  Medical History  DM type 2, Hypothyroidism, Seizures, Depression, Migraine headaches  Significant Hospital Events: Including procedures, antibiotic start and stop dates in addition to other pertinent events   12/3 Admitted after being found with AMS and decreased LOC. Code stroke called, head CT positive for [facility] 12/4  Pipeline embolization of right PCA aneurysm 12/5 follows commands on sedation interruption and but unable to wean due to hypoxia.  12/6 treatment for pneumonia started.  12/7 improving oxygenation but still failed SBT. Central line removed.  12/8 CTPE study shows aspiration pna and a PE.  12/10 diffuse vasospasm, likely terminal  Interim History / Subjective:  Completely unresponsive today. Family coming in.  Objective   Blood pressure 119/72, pulse (!) 130, temperature 100.2 F (37.9 C), temperature source Axillary, resp. rate 14, height 5\' 2"  (1.575 m), weight 58.6 kg, SpO2 96 %.    Vent Mode: PRVC FiO2 (%):  [40 %-100 %] 50 % Set Rate:  [14 bmp] 14 bmp Vt Set:  [400 mL] 400 mL PEEP:  [5 cmH20] 5 cmH20 Plateau Pressure:  [12 cmH20-15 cmH20] 15 cmH20   Intake/Output Summary (Last 24 hours) at 06/27/2021 0845 Last data filed at 06/27/2021 0753 Gross per 24 hour  Intake 2730.03 ml  Output 5555 ml  Net -2824.97 ml    Filed Weights   06/24/21  0500 06/26/21 0500 06/27/21 0500  Weight: 63.5 kg 58.6 kg 58.6 kg    Examination: Unresponsive Fixed, dilated pupils GCS3 No corneal/cough/gag/doll's No spontaneous respirations Lungs with rhonci bilaterally  Ancillary tests personally reviewed.    Intake/Output Summary (Last 24 hours) at 06/27/2021 0845 Last data filed at 06/27/2021 0753 Gross per 24 hour  Intake 2730.03 ml  Output 5555 ml  Net -2824.97 ml   Weight change: 0 kg  Recent Labs  Lab 06/26/21 0436 06/26/21 1540 06/26/21 2031 06/27/21 0424  NA 151* 150*  --  149*  K 3.2* 3.7  --  3.1*  CL 111 109  --  107  CO2 28 31  --  36*  BUN 24* 27*  --  24*  CREATININE 0.79 1.02*  --  0.73  GLUCOSE 171* 218* 334* 112*    Recent Labs  Lab 06/25/21 0304 06/26/21 0436 06/27/21 0424  HGB 9.9* 10.7* 10.8*  HCT 29.8* 32.7* 34.8*  WBC 14.8* 14.5* 22.2*  PLT 303 361 351        Assessment & Plan:   Critically ill due to acute Hunt-Hess 4 , Fischer 4 aneurysmal SAH requiring mechanical ventilation for airway protection.  Acute hypoxic respiratory failure requiring mechanical ventilation due to RLL aspiration.  Acute subsegmental pulmonary embolism MSSA PNA Terminal vasospasm event 12/10  Her exam today is consistent with brain death although no formal testing has been performed.  Family is en route and palliative has been consulted  We are  available to help with formal testing if needed  Continue vent support, abx in interim  Best Practice (right click and "Reselect all SmartList Selections" daily)  Per primary  CRITICAL CARE 32 min  Myrla Halsted MD PCCM

## 2021-06-27 NOTE — Progress Notes (Addendum)
eLink Physician-Brief Progress Note Patient Name: Kelly Gillespie DOB: 06-15-1986 MRN: [id number]   Date of Service  06/27/2021  HPI/Events of Note  Notified regarding tachycardia Telemetry with Aflutter to 150s  Note: NSG updated family overnight. No changes in neuro status. No changes in GOC per family. Goal SBP 170-180s    eICU Interventions  Amio bolus for tachycardia     Intervention Category Intermediate Interventions: Arrhythmia - evaluation and management  [name] 06/27/2021, 12:28 AM

## 2021-06-27 NOTE — Consult Note (Signed)
Consultation Note Date: 06/27/2021   Patient Name: Kelly Gillespie  DOB: [birth year]  MRN: [id number]  Age / Sex: 35 y.o., female  PCP: Patient, No Pcp Per (Inactive) Referring Physician: Consuella Lose, MD  Reason for Consultation: end of life transition  HPI/Patient Profile: 35 y.o. female  with past medical history of diabetes, migraines, seizures admitted on 07/11/2021 with large volume subarachnoid bleed from bleeding aneurysm. Had EVD placed. Recovery has been complicated by pneumonia, PE. On 12/10 had worsening mental status- CT angiogram indicated severe vasospasm and unrecoverable stroke. Palliative consulted for GOC/EOL care.   Primary Decision Maker NEXT OF KIN- patient's mother and father  Discussion: Chart reviewed, report received.  Multiple family members at bedside.  I met in conference room with patient's mom and aunt. Emotional support provided. They are understandably devastated with [name]'s state and prognosis.  Kersti has incredible Secondary school teacher and her spirit is a bright spot in the whole family's life. They are clear that they desire for DNR.  We discussed what transition to comfort would look like.  Patient's father is arriving this evening from [place].      SUMMARY OF RECOMMENDATIONS -DNR -Plan made to meet with mother and father tomorrow to make plan for end of life care and extubation to full comfort -Chaplain is working to get priest for [place] -Given patient's death is imminent and that she will likely die soon after extubation- recommend lifting visitor restrictions and allowing family and friends to visit prior to extubation    Code Status/Advance Care Planning: DNR   Prognosis:   Hours - Days  Discharge Planning: Anticipated Hospital Death  Primary Diagnoses: Present on Admission: **None**  Vital Signs: BP (!) 143/99   Pulse (!) 141    Temp 100.2 F (37.9 C) (Axillary)   Resp 14   Ht $R'5\' 2"'zs$  (1.575 m)   Wt 58.6 kg   SpO2 99%   BMI 23.63 kg/m  Pain Scale: CPOT   Pain Score: Asleep   SpO2: SpO2: 99 % O2 Device:SpO2: 99 % O2 Flow Rate: .   IO: Intake/output summary:  Intake/Output Summary (Last 24 hours) at 06/27/2021 1110 Last data filed at 06/27/2021 1057 Gross per 24 hour  Intake 2498.42 ml  Output 5305 ml  Net -2806.58 ml    LBM: Last BM Date: 06/26/21 Baseline Weight: Weight: 54.2 kg Most recent weight: Weight: 58.6 kg        Thank you for this consult. Palliative medicine will continue to follow and assist as needed.   Time In: 0930 Time Out: 1045 Time Total: 75 minutes Greater than 50%  of this time was spent counseling and coordinating care related to the above assessment and plan.  Signed by: Mariana Kaufman, AGNP-C Palliative Medicine    Please contact Palliative Medicine Team phone at [phone] for questions and concerns.  For individual provider: See Shea [facility]

## 2021-06-27 NOTE — Progress Notes (Signed)
[facility] ADULT ICU REPLACEMENT PROTOCOL   The patient does apply for the [facility] Adult ICU Electrolyte Replacment Protocol based on the criteria listed below:   1.Exclusion criteria: TCTS patients, ECMO patients, and Dialysis patients 2. Is GFR >/= 30 ml/min? Yes.    Patient's GFR today is >60 3. Is SCr </= 2? Yes.   Patient's SCr is 0.73 mg/dL 4. Did SCr increase >/= 0.5 in 24 hours? No. 5.Pt's weight >40kg  Yes.   6. Abnormal electrolyte(s): K 3.1  7. Electrolytes replaced per protocol 8.  Call MD STAT for K+ </= 2.5, Phos </= 1, or Mag </= 1 Physician:    Markus Daft A 06/27/2021 6:23 AM

## 2021-06-28 ENCOUNTER — Inpatient Hospital Stay (HOSPITAL_COMMUNITY): Payer: BC Managed Care – PPO

## 2021-06-28 ENCOUNTER — Other Ambulatory Visit (HOSPITAL_COMMUNITY): Payer: BC Managed Care – PPO

## 2021-06-28 DIAGNOSIS — Z529 Donor of unspecified organ or tissue: Secondary | ICD-10-CM

## 2021-06-28 DIAGNOSIS — J9602 Acute respiratory failure with hypercapnia: Secondary | ICD-10-CM

## 2021-06-28 LAB — POCT I-STAT 7, (LYTES, BLD GAS, ICA,H+H)
Acid-Base Excess: 11 mmol/L — ABNORMAL HIGH (ref 0.0–2.0)
Acid-Base Excess: 11 mmol/L — ABNORMAL HIGH (ref 0.0–2.0)
Acid-Base Excess: 12 mmol/L — ABNORMAL HIGH (ref 0.0–2.0)
Acid-Base Excess: 13 mmol/L — ABNORMAL HIGH (ref 0.0–2.0)
Acid-Base Excess: 14 mmol/L — ABNORMAL HIGH (ref 0.0–2.0)
Acid-Base Excess: 16 mmol/L — ABNORMAL HIGH (ref 0.0–2.0)
Acid-Base Excess: 16 mmol/L — ABNORMAL HIGH (ref 0.0–2.0)
Acid-Base Excess: 16 mmol/L — ABNORMAL HIGH (ref 0.0–2.0)
Bicarbonate: 36.1 mmol/L — ABNORMAL HIGH (ref 20.0–28.0)
Bicarbonate: 37.3 mmol/L — ABNORMAL HIGH (ref 20.0–28.0)
Bicarbonate: 37.5 mmol/L — ABNORMAL HIGH (ref 20.0–28.0)
Bicarbonate: 38.8 mmol/L — ABNORMAL HIGH (ref 20.0–28.0)
Bicarbonate: 40 mmol/L — ABNORMAL HIGH (ref 20.0–28.0)
Bicarbonate: 40.2 mmol/L — ABNORMAL HIGH (ref 20.0–28.0)
Bicarbonate: 42.7 mmol/L — ABNORMAL HIGH (ref 20.0–28.0)
Bicarbonate: 43.5 mmol/L — ABNORMAL HIGH (ref 20.0–28.0)
Calcium, Ion: 1.18 mmol/L (ref 1.15–1.40)
Calcium, Ion: 1.21 mmol/L (ref 1.15–1.40)
Calcium, Ion: 1.22 mmol/L (ref 1.15–1.40)
Calcium, Ion: 1.22 mmol/L (ref 1.15–1.40)
Calcium, Ion: 1.26 mmol/L (ref 1.15–1.40)
Calcium, Ion: 1.27 mmol/L (ref 1.15–1.40)
Calcium, Ion: 1.29 mmol/L (ref 1.15–1.40)
Calcium, Ion: 1.33 mmol/L (ref 1.15–1.40)
HCT: 19 % — ABNORMAL LOW (ref 36.0–46.0)
HCT: 19 % — ABNORMAL LOW (ref 36.0–46.0)
HCT: 23 % — ABNORMAL LOW (ref 36.0–46.0)
HCT: 23 % — ABNORMAL LOW (ref 36.0–46.0)
HCT: 24 % — ABNORMAL LOW (ref 36.0–46.0)
HCT: 29 % — ABNORMAL LOW (ref 36.0–46.0)
HCT: 29 % — ABNORMAL LOW (ref 36.0–46.0)
HCT: 35 % — ABNORMAL LOW (ref 36.0–46.0)
Hemoglobin: 11.9 g/dL — ABNORMAL LOW (ref 12.0–15.0)
Hemoglobin: 6.5 g/dL — CL (ref 12.0–15.0)
Hemoglobin: 6.5 g/dL — CL (ref 12.0–15.0)
Hemoglobin: 7.8 g/dL — ABNORMAL LOW (ref 12.0–15.0)
Hemoglobin: 7.8 g/dL — ABNORMAL LOW (ref 12.0–15.0)
Hemoglobin: 8.2 g/dL — ABNORMAL LOW (ref 12.0–15.0)
Hemoglobin: 9.9 g/dL — ABNORMAL LOW (ref 12.0–15.0)
Hemoglobin: 9.9 g/dL — ABNORMAL LOW (ref 12.0–15.0)
O2 Saturation: 100 %
O2 Saturation: 100 %
O2 Saturation: 100 %
O2 Saturation: 100 %
O2 Saturation: 100 %
O2 Saturation: 100 %
O2 Saturation: 100 %
O2 Saturation: 100 %
Patient temperature: 101.3
Patient temperature: 96.5
Patient temperature: 97.2
Patient temperature: 97.8
Patient temperature: 98.6
Patient temperature: 99
Patient temperature: 99.5
Patient temperature: 99.9
Potassium: 3.4 mmol/L — ABNORMAL LOW (ref 3.5–5.1)
Potassium: 3.4 mmol/L — ABNORMAL LOW (ref 3.5–5.1)
Potassium: 3.4 mmol/L — ABNORMAL LOW (ref 3.5–5.1)
Potassium: 3.5 mmol/L (ref 3.5–5.1)
Potassium: 3.6 mmol/L (ref 3.5–5.1)
Potassium: 3.6 mmol/L (ref 3.5–5.1)
Potassium: 3.6 mmol/L (ref 3.5–5.1)
Potassium: 4.2 mmol/L (ref 3.5–5.1)
Sodium: 142 mmol/L (ref 135–145)
Sodium: 143 mmol/L (ref 135–145)
Sodium: 143 mmol/L (ref 135–145)
Sodium: 144 mmol/L (ref 135–145)
Sodium: 144 mmol/L (ref 135–145)
Sodium: 144 mmol/L (ref 135–145)
Sodium: 145 mmol/L (ref 135–145)
Sodium: 146 mmol/L — ABNORMAL HIGH (ref 135–145)
TCO2: 38 mmol/L — ABNORMAL HIGH (ref 22–32)
TCO2: 39 mmol/L — ABNORMAL HIGH (ref 22–32)
TCO2: 39 mmol/L — ABNORMAL HIGH (ref 22–32)
TCO2: 40 mmol/L — ABNORMAL HIGH (ref 22–32)
TCO2: 41 mmol/L — ABNORMAL HIGH (ref 22–32)
TCO2: 43 mmol/L — ABNORMAL HIGH (ref 22–32)
TCO2: 45 mmol/L — ABNORMAL HIGH (ref 22–32)
TCO2: 46 mmol/L — ABNORMAL HIGH (ref 22–32)
pCO2 arterial: 42.4 mmHg (ref 32.0–48.0)
pCO2 arterial: 51.2 mmHg — ABNORMAL HIGH (ref 32.0–48.0)
pCO2 arterial: 52.7 mmHg — ABNORMAL HIGH (ref 32.0–48.0)
pCO2 arterial: 55.7 mmHg — ABNORMAL HIGH (ref 32.0–48.0)
pCO2 arterial: 58.1 mmHg — ABNORMAL HIGH (ref 32.0–48.0)
pCO2 arterial: 63.9 mmHg — ABNORMAL HIGH (ref 32.0–48.0)
pCO2 arterial: 69.4 mmHg (ref 32.0–48.0)
pCO2 arterial: 82.8 mmHg (ref 32.0–48.0)
pH, Arterial: 7.295 — ABNORMAL LOW (ref 7.350–7.450)
pH, Arterial: 7.401 (ref 7.350–7.450)
pH, Arterial: 7.408 (ref 7.350–7.450)
pH, Arterial: 7.431 (ref 7.350–7.450)
pH, Arterial: 7.436 (ref 7.350–7.450)
pH, Arterial: 7.463 — ABNORMAL HIGH (ref 7.350–7.450)
pH, Arterial: 7.488 — ABNORMAL HIGH (ref 7.350–7.450)
pH, Arterial: 7.579 — ABNORMAL HIGH (ref 7.350–7.450)
pO2, Arterial: 176 mmHg — ABNORMAL HIGH (ref 83.0–108.0)
pO2, Arterial: 271 mmHg — ABNORMAL HIGH (ref 83.0–108.0)
pO2, Arterial: 278 mmHg — ABNORMAL HIGH (ref 83.0–108.0)
pO2, Arterial: 293 mmHg — ABNORMAL HIGH (ref 83.0–108.0)
pO2, Arterial: 335 mmHg — ABNORMAL HIGH (ref 83.0–108.0)
pO2, Arterial: 335 mmHg — ABNORMAL HIGH (ref 83.0–108.0)
pO2, Arterial: 340 mmHg — ABNORMAL HIGH (ref 83.0–108.0)
pO2, Arterial: 496 mmHg — ABNORMAL HIGH (ref 83.0–108.0)

## 2021-06-28 LAB — CBC
HCT: 31 % — ABNORMAL LOW (ref 36.0–46.0)
HCT: 25 % — ABNORMAL LOW (ref 36.0–46.0)
Hemoglobin: 9.5 g/dL — ABNORMAL LOW (ref 12.0–15.0)
Hemoglobin: 7.5 g/dL — ABNORMAL LOW (ref 12.0–15.0)
MCH: 27.8 pg (ref 26.0–34.0)
MCH: 27.1 pg (ref 26.0–34.0)
MCHC: 30.6 g/dL (ref 30.0–36.0)
MCHC: 30 g/dL (ref 30.0–36.0)
MCV: 90.6 fL (ref 80.0–100.0)
MCV: 90.3 fL (ref 80.0–100.0)
Platelets: 282 10*3/uL (ref 150–400)
Platelets: 213 10*3/uL (ref 150–400)
RBC: 3.42 MIL/uL — ABNORMAL LOW (ref 3.87–5.11)
RBC: 2.77 MIL/uL — ABNORMAL LOW (ref 3.87–5.11)
RDW: 13.4 % (ref 11.5–15.5)
RDW: 12.9 % (ref 11.5–15.5)
WBC: 32 10*3/uL — ABNORMAL HIGH (ref 4.0–10.5)
WBC: 20.5 10*3/uL — ABNORMAL HIGH (ref 4.0–10.5)
nRBC: 0 % (ref 0.0–0.2)
nRBC: 0 % (ref 0.0–0.2)

## 2021-06-28 LAB — RESP PANEL BY RT-PCR (FLU A&B, COVID) ARPGX2
Influenza A by PCR: NEGATIVE
Influenza B by PCR: NEGATIVE
SARS Coronavirus 2 by RT PCR: NEGATIVE

## 2021-06-28 LAB — COMPREHENSIVE METABOLIC PANEL
ALT: 31 U/L (ref 0–44)
ALT: 23 U/L (ref 0–44)
ALT: 19 U/L (ref 0–44)
AST: 32 U/L (ref 15–41)
AST: 22 U/L (ref 15–41)
AST: 18 U/L (ref 15–41)
Albumin: 1.8 g/dL — ABNORMAL LOW (ref 3.5–5.0)
Albumin: 1.7 g/dL — ABNORMAL LOW (ref 3.5–5.0)
Albumin: 1.9 g/dL — ABNORMAL LOW (ref 3.5–5.0)
Alkaline Phosphatase: 181 U/L — ABNORMAL HIGH (ref 38–126)
Alkaline Phosphatase: 152 U/L — ABNORMAL HIGH (ref 38–126)
Alkaline Phosphatase: 135 U/L — ABNORMAL HIGH (ref 38–126)
Anion gap: 7 (ref 5–15)
Anion gap: 10 (ref 5–15)
Anion gap: 6 (ref 5–15)
BUN: 22 mg/dL — ABNORMAL HIGH (ref 6–20)
BUN: 21 mg/dL — ABNORMAL HIGH (ref 6–20)
BUN: 22 mg/dL — ABNORMAL HIGH (ref 6–20)
CO2: 37 mmol/L — ABNORMAL HIGH (ref 22–32)
CO2: 34 mmol/L — ABNORMAL HIGH (ref 22–32)
CO2: 33 mmol/L — ABNORMAL HIGH (ref 22–32)
Calcium: 8.7 mg/dL — ABNORMAL LOW (ref 8.9–10.3)
Calcium: 8.6 mg/dL — ABNORMAL LOW (ref 8.9–10.3)
Calcium: 9.2 mg/dL (ref 8.9–10.3)
Chloride: 99 mmol/L (ref 98–111)
Chloride: 98 mmol/L (ref 98–111)
Chloride: 104 mmol/L (ref 98–111)
Creatinine, Ser: 0.74 mg/dL (ref 0.44–1.00)
Creatinine, Ser: 0.71 mg/dL (ref 0.44–1.00)
Creatinine, Ser: 0.72 mg/dL (ref 0.44–1.00)
GFR, Estimated: >60 mL/min (ref >60)
GFR, Estimated: >60 mL/min (ref >60)
GFR, Estimated: >60 mL/min (ref >60)
Glucose, Bld: 384 mg/dL — ABNORMAL HIGH (ref 70–99)
Glucose, Bld: 341 mg/dL — ABNORMAL HIGH (ref 70–99)
Glucose, Bld: 205 mg/dL — ABNORMAL HIGH (ref 70–99)
Potassium: 3.6 mmol/L (ref 3.5–5.1)
Potassium: 4.2 mmol/L (ref 3.5–5.1)
Potassium: 3.5 mmol/L (ref 3.5–5.1)
Sodium: 143 mmol/L (ref 135–145)
Sodium: 142 mmol/L (ref 135–145)
Sodium: 143 mmol/L (ref 135–145)
Total Bilirubin: 0.5 mg/dL (ref 0.3–1.2)
Total Bilirubin: 0.4 mg/dL (ref 0.3–1.2)
Total Bilirubin: 0.3 mg/dL (ref 0.3–1.2)
Total Protein: 6.1 g/dL — ABNORMAL LOW (ref 6.5–8.1)
Total Protein: 5.8 g/dL — ABNORMAL LOW (ref 6.5–8.1)
Total Protein: 5.6 g/dL — ABNORMAL LOW (ref 6.5–8.1)

## 2021-06-28 LAB — LIPASE, BLOOD: Lipase: 44 U/L (ref 11–51)

## 2021-06-28 LAB — CBC WITH DIFFERENTIAL/PLATELET
Abs Immature Granulocytes: 0.34 10*3/uL — ABNORMAL HIGH (ref 0.00–0.07)
Abs Immature Granulocytes: 0 10*3/uL (ref 0.00–0.07)
Basophils Absolute: 0.1 10*3/uL (ref 0.0–0.1)
Basophils Absolute: 0 10*3/uL (ref 0.0–0.1)
Basophils Relative: 0 %
Basophils Relative: 0 %
Eosinophils Absolute: 0.1 10*3/uL (ref 0.0–0.5)
Eosinophils Absolute: 0 10*3/uL (ref 0.0–0.5)
Eosinophils Relative: 0 %
Eosinophils Relative: 0 %
HCT: 28.2 % — ABNORMAL LOW (ref 36.0–46.0)
HCT: 22.2 % — ABNORMAL LOW (ref 36.0–46.0)
Hemoglobin: 8.7 g/dL — ABNORMAL LOW (ref 12.0–15.0)
Hemoglobin: 7.1 g/dL — ABNORMAL LOW (ref 12.0–15.0)
Immature Granulocytes: 1 %
Lymphocytes Relative: 6 %
Lymphocytes Relative: 1 %
Lymphs Abs: 1.5 10*3/uL (ref 0.7–4.0)
Lymphs Abs: 0.3 10*3/uL — ABNORMAL LOW (ref 0.7–4.0)
MCH: 28.1 pg (ref 26.0–34.0)
MCH: 28.5 pg (ref 26.0–34.0)
MCHC: 30.9 g/dL (ref 30.0–36.0)
MCHC: 32 g/dL (ref 30.0–36.0)
MCV: 91 fL (ref 80.0–100.0)
MCV: 89.2 fL (ref 80.0–100.0)
Monocytes Absolute: 0.9 10*3/uL (ref 0.1–1.0)
Monocytes Absolute: 0.3 10*3/uL (ref 0.1–1.0)
Monocytes Relative: 3 %
Monocytes Relative: 1 %
Neutro Abs: 24.5 10*3/uL — ABNORMAL HIGH (ref 1.7–7.7)
Neutro Abs: 24.7 10*3/uL — ABNORMAL HIGH (ref 1.7–7.7)
Neutrophils Relative %: 90 %
Neutrophils Relative %: 98 %
Platelets: 240 10*3/uL (ref 150–400)
Platelets: 221 10*3/uL (ref 150–400)
RBC: 3.1 MIL/uL — ABNORMAL LOW (ref 3.87–5.11)
RBC: 2.49 MIL/uL — ABNORMAL LOW (ref 3.87–5.11)
RDW: 13.2 % (ref 11.5–15.5)
RDW: 12.9 % (ref 11.5–15.5)
WBC: 27.3 10*3/uL — ABNORMAL HIGH (ref 4.0–10.5)
WBC: 25.2 10*3/uL — ABNORMAL HIGH (ref 4.0–10.5)
nRBC: 0 % (ref 0.0–0.2)
nRBC: 0.1 % (ref 0.0–0.2)
nRBC: 1 /100 WBC — ABNORMAL HIGH

## 2021-06-28 LAB — BASIC METABOLIC PANEL
Anion gap: 6 (ref 5–15)
BUN: 19 mg/dL (ref 6–20)
CO2: 35 mmol/L — ABNORMAL HIGH (ref 22–32)
Calcium: 8.5 mg/dL — ABNORMAL LOW (ref 8.9–10.3)
Chloride: 100 mmol/L (ref 98–111)
Creatinine, Ser: 0.65 mg/dL (ref 0.44–1.00)
GFR, Estimated: >60 mL/min (ref >60)
Glucose, Bld: 296 mg/dL — ABNORMAL HIGH (ref 70–99)
Potassium: 3.7 mmol/L (ref 3.5–5.1)
Sodium: 141 mmol/L (ref 135–145)

## 2021-06-28 LAB — URINALYSIS, MICROSCOPIC (REFLEX)
Bacteria, UA: NONE SEEN
Squamous Epithelial / HPF: NONE SEEN (ref 0–5)

## 2021-06-28 LAB — PROTIME-INR
INR: 1.2 (ref 0.8–1.2)
INR: 1.1 (ref 0.8–1.2)
Prothrombin Time: 15.1 seconds (ref 11.4–15.2)
Prothrombin Time: 14.4 seconds (ref 11.4–15.2)

## 2021-06-28 LAB — GLUCOSE, CAPILLARY
Glucose-Capillary: 279 mg/dL — ABNORMAL HIGH (ref 70–99)
Glucose-Capillary: 252 mg/dL — ABNORMAL HIGH (ref 70–99)
Glucose-Capillary: 360 mg/dL — ABNORMAL HIGH (ref 70–99)
Glucose-Capillary: 264 mg/dL — ABNORMAL HIGH (ref 70–99)
Glucose-Capillary: 290 mg/dL — ABNORMAL HIGH (ref 70–99)
Glucose-Capillary: 240 mg/dL — ABNORMAL HIGH (ref 70–99)
Glucose-Capillary: 222 mg/dL — ABNORMAL HIGH (ref 70–99)
Glucose-Capillary: 210 mg/dL — ABNORMAL HIGH (ref 70–99)
Glucose-Capillary: 230 mg/dL — ABNORMAL HIGH (ref 70–99)
Glucose-Capillary: 214 mg/dL — ABNORMAL HIGH (ref 70–99)
Glucose-Capillary: 220 mg/dL — ABNORMAL HIGH (ref 70–99)
Glucose-Capillary: 195 mg/dL — ABNORMAL HIGH (ref 70–99)

## 2021-06-28 LAB — CK TOTAL AND CKMB (NOT AT ARMC)
CK, MB: 6 ng/mL — ABNORMAL HIGH (ref 0.5–5.0)
CK, MB: 2.8 ng/mL (ref 0.5–5.0)
CK, MB: 1.5 ng/mL (ref 0.5–5.0)
Relative Index: INVALID (ref 0.0–2.5)
Relative Index: INVALID (ref 0.0–2.5)
Relative Index: INVALID (ref 0.0–2.5)
Total CK: 43 U/L (ref 38–234)
Total CK: 31 U/L — ABNORMAL LOW (ref 38–234)
Total CK: 31 U/L — ABNORMAL LOW (ref 38–234)

## 2021-06-28 LAB — MAGNESIUM
Magnesium: 1.9 mg/dL (ref 1.7–2.4)
Magnesium: 2.1 mg/dL (ref 1.7–2.4)

## 2021-06-28 LAB — URINALYSIS, ROUTINE W REFLEX MICROSCOPIC
Bilirubin Urine: NEGATIVE
Glucose, UA: >=500 mg/dL — AB
Ketones, ur: NEGATIVE mg/dL
Leukocytes,Ua: NEGATIVE
Nitrite: NEGATIVE
Protein, ur: NEGATIVE mg/dL
Specific Gravity, Urine: 1.01 (ref 1.005–1.030)
pH: 8 (ref 5.0–8.0)

## 2021-06-28 LAB — TROPONIN I (HIGH SENSITIVITY)
Troponin I (High Sensitivity): 36 ng/L — ABNORMAL HIGH (ref <18)
Troponin I (High Sensitivity): 25 ng/L — ABNORMAL HIGH (ref <18)

## 2021-06-28 LAB — ABO/RH
ABO/RH(D): A POS
PT AG Type: POSITIVE

## 2021-06-28 LAB — URINE CULTURE: Culture: NO GROWTH

## 2021-06-28 LAB — APTT
aPTT: 35 seconds (ref 24–36)
aPTT: 31 seconds (ref 24–36)
aPTT: 33 seconds (ref 24–36)

## 2021-06-28 LAB — BILIRUBIN, DIRECT
Bilirubin, Direct: 0.1 mg/dL (ref 0.0–0.2)
Bilirubin, Direct: 0.1 mg/dL (ref 0.0–0.2)
Bilirubin, Direct: <0.1 mg/dL (ref 0.0–0.2)

## 2021-06-28 LAB — ECHOCARDIOGRAM COMPLETE
Area-P 1/2: 2.48 cm2
Height: 62 in
S' Lateral: 2.5 cm
Weight: 2049.4 oz

## 2021-06-28 LAB — AMYLASE: Amylase: 39 U/L (ref 28–100)

## 2021-06-28 LAB — PHOSPHORUS: Phosphorus: 1.8 mg/dL — ABNORMAL LOW (ref 2.5–4.6)

## 2021-06-28 LAB — PREGNANCY, URINE: Preg Test, Ur: NEGATIVE

## 2021-06-28 MED ORDER — CHLORHEXIDINE GLUCONATE 0.12 % MT SOLN
OROMUCOSAL | Status: AC
  Administered 2021-06-28: 15 mL via OROMUCOSAL
  Filled 2021-06-28: qty 15

## 2021-06-28 MED ORDER — DEXTROSE IN LACTATED RINGERS 5 % IV SOLN: INTRAVENOUS | Status: DC

## 2021-06-28 MED ORDER — INSULIN GLARGINE-YFGN 100 UNIT/ML ~~LOC~~ SOLN
25.0000 [IU] | Freq: Every day | SUBCUTANEOUS | Status: DC
  Administered 2021-06-28: 25 [IU] via SUBCUTANEOUS
  Filled 2021-06-28: qty 0.25

## 2021-06-28 MED ORDER — INSULIN ASPART 100 UNIT/ML IJ SOLN
8.0000 [IU] | INTRAMUSCULAR | Status: DC
  Administered 2021-06-28: 8 [IU] via SUBCUTANEOUS

## 2021-06-28 MED ORDER — POTASSIUM CHLORIDE 10 MEQ/50ML IV SOLN
10.0000 meq | INTRAVENOUS | Status: AC
  Administered 2021-06-28 (×2): 10 meq via INTRAVENOUS
  Filled 2021-06-28 (×2): qty 50

## 2021-06-28 MED ORDER — LACTATED RINGERS IV SOLN: INTRAVENOUS | Status: DC

## 2021-06-28 MED ORDER — INSULIN REGULAR(HUMAN) IN NACL 100-0.9 UT/100ML-% IV SOLN
INTRAVENOUS | Status: DC
  Administered 2021-06-28 (×2): 12 [IU]/h via INTRAVENOUS
  Administered 2021-06-28: 13 [IU]/h via INTRAVENOUS
  Administered 2021-06-29: 16 [IU]/h via INTRAVENOUS
  Administered 2021-06-29: 13 [IU]/h via INTRAVENOUS
  Administered 2021-06-29: 17 [IU]/h via INTRAVENOUS
  Filled 2021-06-28 (×5): qty 100

## 2021-06-28 MED ORDER — ALBUMIN HUMAN 5 % IV SOLN
12.5000 g | Freq: Once | INTRAVENOUS | Status: AC
Start: 2021-06-28 — End: 2021-06-28
  Administered 2021-06-28: 12.5 g via INTRAVENOUS
  Filled 2021-06-28: qty 250

## 2021-06-28 MED ORDER — SODIUM CHLORIDE 0.9 % IV SOLN
2000.0000 mg | Freq: Once | INTRAVENOUS | Status: AC
  Administered 2021-06-28: 2000 mg via INTRAVENOUS
  Filled 2021-06-28: qty 32

## 2021-06-28 MED ORDER — ALBUMIN HUMAN 5 % IV SOLN
25.0000 g | Freq: Once | INTRAVENOUS | Status: AC
  Administered 2021-06-28: 25 g via INTRAVENOUS
  Filled 2021-06-28: qty 500

## 2021-06-28 MED ORDER — DEXTROSE 50 % IV SOLN: 0.0000 mL | INTRAVENOUS | Status: DC | PRN

## 2021-06-28 NOTE — Progress Notes (Signed)
  NEUROSURGERY PROGRESS NOTE   Spoke with patient's family at bedside, offered condolences on their loss. I shared with them the results of the formal brain death examination and the time of death. They are understandably upset but appreciative of the care she received. All their questions were answered.  Lisbeth Renshaw, MD [facility]

## 2021-06-28 NOTE — Procedures (Signed)
Adult Brain Death Determination  Time of Examination: 07/12/2021 2:27 PM  No Evidence of /Cause of Reversible CNS Depression  Core temperature must be greater >36 degrees. Last temp: Temp: 99.9 F (37.7 C) (Note: If unable to achieve normothermia after 12 hours of temperature management, may consider proceeding with Brain Death Evaluation.):    yes  Evidence of severe metabolic perturbations that could potentate CNS depression. Consider glucose, Na, creatinine, PaCO2, SaO2.:    Absent  Evidence of drugs, by history or measurement, that could potentiate central nervous system depression: narcotics, ethanol, benzodiazepines, barbiturates, neuromuscular blockade.:     Absent  Absence of Cortical Function  GCS = 3:    yes  Absence of Brain Stem Reflexes and Responses  Pupils light-fixed    yes  Corneal reflexes:    Absent  Response to upper and lower airway stimulation, such as pharyngeal and endotracheal suctioning.:    Absent  Ocular response to head turning (eye movement).    Absent  Absence of Spontaneous Respirations  (Apnea test performed per Brain Death Policy. If not met due to hemodynamic/ventilatory instability, then perform EEG, TCD, or cerebral blow flow studies.)  1.   Spontaneous Respirations   Absent  2.   PaCO2 at start of apnea test:  47  3.   PaCO2 at end of apnea test:  82  4.   CO2 rise of 20 or greater from baseline:   yes  Document Confirmatory Test Utilized: (Optional) Nuclear cerebral flow, cerebral angiography (CT/MR angio), transcranial Doppler ultrasound, EEG, SSEP (record results).  Test results (if available):  Yes  Patient pronounced dead by neurological criteria at 2:17 PM on 07/12/2021.  Jacky Kindle, MD 06/18/2021 2:27 PM

## 2021-06-28 NOTE — Progress Notes (Signed)
NAME:  Kelly Gillespie, MRN:  [id number], DOB:  January 31, 1986, LOS: 9 ADMISSION DATE:  07/05/2021, CONSULTATION DATE:  07/17/2021 REFERRING MD:  Dr. Conchita Paris , CHIEF COMPLAINT:  Rupture cerebral aneurysm    History of Present Illness:  35 yo female presented with headache, vomiting and then AMS.  Noted to have Lt side weakness in ER and asymmetric pupils with SBP 220.  CT head showed aneurysmal bleeding with large volume SAH.  Had EVD placed by neurosurgery and then had cerebral angiogram showing wide neck Rt P1 aneurysm.  She required intubation for airway protection.  PCCM asked to assist with management in ICU.  Pertinent  Medical History  DM type 2, Hypothyroidism, Seizures, Depression, Migraine headaches  Significant Hospital Events: Including procedures, antibiotic start and stop dates in addition to other pertinent events   12/3 Admitted after being found with AMS and decreased LOC. Code stroke called, head CT positive for [facility] 12/4  Pipeline embolization of right PCA aneurysm 12/5 follows commands on sedation interruption and but unable to wean due to hypoxia.  12/6 treatment for pneumonia started.  12/7 improving oxygenation but still failed SBT. Central line removed.  12/8 CTPE study shows aspiration pna and a PE.  07/25/23 diffuse vasospasm, likely terminal 12/12 Neuro exam remains poor but no formal brain death testing has been preformed   Interim History / Subjective:  No pupillary or pain response on exam   Objective   Blood pressure 127/81, pulse (!) 109, temperature 98.3 F (36.8 C), temperature source Axillary, resp. rate 14, height 5\' 2"  (1.575 m), weight 58.1 kg, SpO2 100 %.    Vent Mode: PRVC FiO2 (%):  [40 %-100 %] 40 % Set Rate:  [14 bmp] 14 bmp Vt Set:  [400 mL] 400 mL PEEP:  [5 cmH20-8 cmH20] 5 cmH20 Plateau Pressure:  [14 cmH20-15 cmH20] 14 cmH20   Intake/Output Summary (Last 24 hours) at 06/24/2021 0732 Last data filed at 06/27/2021 0700 Gross per 24 hour   Intake 4056.38 ml  Output 4807 ml  Net -750.62 ml    Filed Weights   2021-07-24 0500 06/27/21 0500 07/17/2021 0500  Weight: 58.6 kg 58.6 kg 58.1 kg    Examination: General: Acute well developed adult female lying in bed on mechanical ventilation, in NAD HEENT: ETT, MM pink/moist, PERRL,  Neuro: Unresponsive on vent  CV: s1s2 regular rate and rhythm, no murmur, rubs, or gallops,  PULM:  Clear to ascultation bilaterally on vent, no increased work of breathing GI: soft, bowel sounds active in all 4 quadrants, non-tender, non-distended, tolerating TF Extremities: warm/dry, no edema  Skin: no rashes or lesions  Assessment & Plan:   Critically ill due to acute Hunt-Hess 4 , Fischer 4 aneurysmal SAH requiring mechanical ventilation for airway protection.  Terminal vasospasm event Jul 25, 2023  -No formal brain death testing has been preformed yet per family request  P: Management per neurology/neurosurgery   Maintain neuro protective measures; goal for eurothermia, euglycemia, eunatermia, normoxia, and PCO2 goal of 35-40 Nutrition and bowel regiment  Seizure precautions  Aspirations precautions  Continue discussion with palliative care to facilitate goals of care moving forward  Remains on Brilinta  Continue Nimodipine   Acute hypoxic respiratory failure requiring mechanical Acute subsegmental pulmonary embolism MSSA PNA P: Continue ventilator support with lung protective strategies  Wean PEEP and FiO2 for sats greater than 90%. Head of bed elevated 30 degrees. Plateau pressures less than 30 cm H20.  Follow intermittent chest x-ray and ABG.   SAT/SBT  as tolerated, mentation preclude extubation  Ensure adequate pulmonary hygiene  Follow cultures  VAP bundle in place  PAD protocol Continue Ceftriaxone   Hyperglycemia P: Continue SSI CBG checks q4  CBG goal 140-180  Anemia  P: Trend CBC  Hgb goal > 7  Best Practice (right click and "Reselect all SmartList Selections" daily)   Per primary   CRITICAL CARE Performed by: Sarena Jezek D. Harris   Total critical care time: 40 minutes  Critical care time was exclusive of separately billable procedures and treating other patients.  Critical care was necessary to treat or prevent imminent or life-threatening deterioration.  Critical care was time spent personally by me on the following activities: development of treatment plan with patient and/or surrogate as well as nursing, discussions with consultants, evaluation of patient's response to treatment, examination of patient, obtaining history from patient or surrogate, ordering and performing treatments and interventions, ordering and review of laboratory studies, ordering and review of radiographic studies, pulse oximetry and re-evaluation of patient's condition.  Jammi Morrissette D. Tiburcio Pea, NP-C [facility] Personal contact information can be found on Amion  07/11/2021, 7:36 AM

## 2021-06-28 NOTE — Progress Notes (Signed)
Apnea test performed with RT X2, Dr. Merrily Pew, and RN X2 at bedside.  Initial ABG: 7.47/51/274/37.5 RT then turned flow meter to 8 l with tubing, and placed vent in standby. Pt then was disconnected from vent and tubing placed in ETT with cuff deflated. At 8 mins the ABG was: 7.29/82/333/40.2 Pt was hemodynamically stable throughout procedure. Pt was placed back on full support on ventilator with cuff inflated.

## 2021-06-28 NOTE — Progress Notes (Signed)
Occupational Therapy Discharge Patient Details Name: Kelly Gillespie MRN: [id number] DOB: [birth year] Today's Date: 06/26/2021 Time:  -     Patient discharged from OT services secondary to medical decline - will need to re-order OT to resume therapy services.  Please see latest therapy progress note for current level of functioning and progress toward goals.    Progress and discharge plan discussed with patient and/or caregiver: Patient unable to participate in discharge planning and no caregivers available Note speak of comfort measures with one way extubation today.  GO     [name], OTR/L  Acute Rehabilitation Services Pager: [phone] Office: [phone] .  07/01/2021, 9:07 AM

## 2021-06-28 NOTE — Progress Notes (Signed)
Daily Progress Note   Patient Name: Kelly Gillespie       Date: 07/01/2021 DOB: Jul 24, 1985  Age: 36 y.o. MRN#: [id number] Attending Physician: Consuella Lose, MD Primary Care Physician: Patient, No Pcp Per (Inactive) Admit Date: 07/10/2021  Reason for Consultation/Follow-up: Establishing goals of care  Patient Profile/HPI:  35 y.o. female  with past medical history of diabetes, migraines, seizures admitted on 07/08/2021 with large volume subarachnoid bleed from bleeding aneurysm. Had EVD placed. Recovery has been complicated by pneumonia, PE. On 12/10 had worsening mental status- CT angiogram indicated severe vasospasm and unrecoverable stroke. Palliative consulted for GOC/EOL care.   Subjective: Met with patient's mom, dad, and aunt. Emotional support provided. [place] is involved. Time and space given to reflect on Kelly Gillespie's kind heart, her artistic spirit. Family is comforted by knowing Kelly Gillespie will be reunited with her Grandmother.    Vital Signs: BP 126/80   Pulse (!) 107   Temp 99.5 F (37.5 C) (Axillary)   Resp 16   Ht 5' 2"  (1.575 m)   Wt 58.1 kg Comment: with cooling blanket  SpO2 100%   BMI 23.43 kg/m  SpO2: SpO2: 100 % O2 Device: O2 Device: Ventilator O2 Flow Rate:    Intake/output summary:  Intake/Output Summary (Last 24 hours) at 07/13/2021 1058 Last data filed at 07/14/2021 5638 Gross per 24 hour  Intake 3986.79 ml  Output 3727 ml  Net 259.79 ml   LBM: Last BM Date: 07/13/2021 Baseline Weight: Weight: 54.2 kg Most recent weight: Weight: 58.1 kg (with cooling blanket)       Palliative Assessment/Data: PPS: 10%        Palliative Care Assessment & Plan    Assessment/Recommendations/Plan  Continue current end of life plan per [employer]   Code Status: DNR  Prognosis:  Hours - Days  Discharge Planning: Anticipated Hospital Death  Care plan was discussed with family, care team.   Thank you for allowing the Palliative Medicine Team to assist in the care of this patient.  Total time: 40 mins Prolonged billing: No     Greater than 50%  of this time was spent counseling and coordinating care related to the above assessment and plan.  Mariana Kaufman, AGNP-C Palliative Medicine   Please contact Palliative Medicine Team phone at [phone] for questions and concerns.

## 2021-06-29 ENCOUNTER — Inpatient Hospital Stay (HOSPITAL_COMMUNITY): Payer: BC Managed Care – PPO | Admitting: Certified Registered"

## 2021-06-29 ENCOUNTER — Encounter (HOSPITAL_COMMUNITY): Admission: EM | Disposition: E | Payer: Self-pay | Source: Home / Self Care | Attending: Neurosurgery

## 2021-06-29 DIAGNOSIS — I609 Nontraumatic subarachnoid hemorrhage, unspecified: Secondary | ICD-10-CM | POA: Diagnosis not present

## 2021-06-29 DIAGNOSIS — S066X9A Traumatic subarachnoid hemorrhage with loss of consciousness of unspecified duration, initial encounter: Secondary | ICD-10-CM | POA: Diagnosis not present

## 2021-06-29 DIAGNOSIS — E119 Type 2 diabetes mellitus without complications: Secondary | ICD-10-CM | POA: Diagnosis not present

## 2021-06-29 DIAGNOSIS — G9382 Brain death: Secondary | ICD-10-CM | POA: Diagnosis not present

## 2021-06-29 DIAGNOSIS — D649 Anemia, unspecified: Secondary | ICD-10-CM | POA: Diagnosis not present

## 2021-06-29 HISTORY — PX: ORGAN PROCUREMENT: SHX5270

## 2021-06-29 LAB — URINALYSIS, ROUTINE W REFLEX MICROSCOPIC
Bilirubin Urine: NEGATIVE
Glucose, UA: NEGATIVE mg/dL
Ketones, ur: NEGATIVE mg/dL
Leukocytes,Ua: NEGATIVE
Nitrite: NEGATIVE
Protein, ur: NEGATIVE mg/dL
Specific Gravity, Urine: <1.005 — ABNORMAL LOW (ref 1.005–1.030)
pH: 6.5 (ref 5.0–8.0)

## 2021-06-29 LAB — POCT I-STAT 7, (LYTES, BLD GAS, ICA,H+H)
Acid-Base Excess: 9 mmol/L — ABNORMAL HIGH (ref 0.0–2.0)
Acid-Base Excess: 10 mmol/L — ABNORMAL HIGH (ref 0.0–2.0)
Bicarbonate: 34.3 mmol/L — ABNORMAL HIGH (ref 20.0–28.0)
Bicarbonate: 34.2 mmol/L — ABNORMAL HIGH (ref 20.0–28.0)
Calcium, Ion: 1.35 mmol/L (ref 1.15–1.40)
Calcium, Ion: 1.35 mmol/L (ref 1.15–1.40)
HCT: 19 % — ABNORMAL LOW (ref 36.0–46.0)
HCT: 24 % — ABNORMAL LOW (ref 36.0–46.0)
Hemoglobin: 6.5 g/dL — CL (ref 12.0–15.0)
Hemoglobin: 8.2 g/dL — ABNORMAL LOW (ref 12.0–15.0)
O2 Saturation: 100 %
O2 Saturation: 99 %
Patient temperature: 98.6
Patient temperature: 98.7
Potassium: 3.7 mmol/L (ref 3.5–5.1)
Potassium: 3.5 mmol/L (ref 3.5–5.1)
Sodium: 146 mmol/L — ABNORMAL HIGH (ref 135–145)
Sodium: 153 mmol/L — ABNORMAL HIGH (ref 135–145)
TCO2: 36 mmol/L — ABNORMAL HIGH (ref 22–32)
TCO2: 36 mmol/L — ABNORMAL HIGH (ref 22–32)
pCO2 arterial: 53.8 mmHg — ABNORMAL HIGH (ref 32.0–48.0)
pCO2 arterial: 46.9 mmHg (ref 32.0–48.0)
pH, Arterial: 7.412 (ref 7.350–7.450)
pH, Arterial: 7.471 — ABNORMAL HIGH (ref 7.350–7.450)
pO2, Arterial: 247 mmHg — ABNORMAL HIGH (ref 83.0–108.0)
pO2, Arterial: 119 mmHg — ABNORMAL HIGH (ref 83.0–108.0)

## 2021-06-29 LAB — BLOOD CULTURE ID PANEL (REFLEXED) - BCID2

## 2021-06-29 LAB — COMPREHENSIVE METABOLIC PANEL
ALT: 20 U/L (ref 0–44)
ALT: 21 U/L (ref 0–44)
AST: 22 U/L (ref 15–41)
AST: 32 U/L (ref 15–41)
Albumin: 2.1 g/dL — ABNORMAL LOW (ref 3.5–5.0)
Albumin: 2 g/dL — ABNORMAL LOW (ref 3.5–5.0)
Alkaline Phosphatase: 132 U/L — ABNORMAL HIGH (ref 38–126)
Alkaline Phosphatase: 137 U/L — ABNORMAL HIGH (ref 38–126)
Anion gap: 7 (ref 5–15)
Anion gap: 8 (ref 5–15)
BUN: 17 mg/dL (ref 6–20)
BUN: 14 mg/dL (ref 6–20)
CO2: 32 mmol/L (ref 22–32)
CO2: 31 mmol/L (ref 22–32)
Calcium: 9.2 mg/dL (ref 8.9–10.3)
Calcium: 9.3 mg/dL (ref 8.9–10.3)
Chloride: 107 mmol/L (ref 98–111)
Chloride: 115 mmol/L — ABNORMAL HIGH (ref 98–111)
Creatinine, Ser: 0.84 mg/dL (ref 0.44–1.00)
Creatinine, Ser: 0.8 mg/dL (ref 0.44–1.00)
GFR, Estimated: >60 mL/min (ref >60)
GFR, Estimated: >60 mL/min (ref >60)
Glucose, Bld: 197 mg/dL — ABNORMAL HIGH (ref 70–99)
Glucose, Bld: 170 mg/dL — ABNORMAL HIGH (ref 70–99)
Potassium: 3.7 mmol/L (ref 3.5–5.1)
Potassium: 3.6 mmol/L (ref 3.5–5.1)
Sodium: 146 mmol/L — ABNORMAL HIGH (ref 135–145)
Sodium: 154 mmol/L — ABNORMAL HIGH (ref 135–145)
Total Bilirubin: 0.3 mg/dL (ref 0.3–1.2)
Total Bilirubin: 0.6 mg/dL (ref 0.3–1.2)
Total Protein: 5.4 g/dL — ABNORMAL LOW (ref 6.5–8.1)
Total Protein: 5.3 g/dL — ABNORMAL LOW (ref 6.5–8.1)

## 2021-06-29 LAB — BILIRUBIN, DIRECT: Bilirubin, Direct: 0.2 mg/dL (ref 0.0–0.2)

## 2021-06-29 LAB — CBC WITH DIFFERENTIAL/PLATELET
Abs Immature Granulocytes: 0.31 10*3/uL — ABNORMAL HIGH (ref 0.00–0.07)
Basophils Absolute: 0 10*3/uL (ref 0.0–0.1)
Basophils Relative: 0 %
Eosinophils Absolute: 0 10*3/uL (ref 0.0–0.5)
Eosinophils Relative: 0 %
HCT: 21 % — ABNORMAL LOW (ref 36.0–46.0)
Hemoglobin: 6.4 g/dL — CL (ref 12.0–15.0)
Immature Granulocytes: 1 %
Lymphocytes Relative: 2 %
Lymphs Abs: 0.4 10*3/uL — ABNORMAL LOW (ref 0.7–4.0)
MCH: 27.5 pg (ref 26.0–34.0)
MCHC: 30.5 g/dL (ref 30.0–36.0)
MCV: 90.1 fL (ref 80.0–100.0)
Monocytes Absolute: 0.3 10*3/uL (ref 0.1–1.0)
Monocytes Relative: 1 %
Neutro Abs: 22.5 10*3/uL — ABNORMAL HIGH (ref 1.7–7.7)
Neutrophils Relative %: 96 %
Platelets: 258 10*3/uL (ref 150–400)
RBC: 2.33 MIL/uL — ABNORMAL LOW (ref 3.87–5.11)
RDW: 13.5 % (ref 11.5–15.5)
WBC: 23.5 10*3/uL — ABNORMAL HIGH (ref 4.0–10.5)
nRBC: 0.1 % (ref 0.0–0.2)

## 2021-06-29 LAB — GLUCOSE, CAPILLARY
Glucose-Capillary: 134 mg/dL — ABNORMAL HIGH (ref 70–99)
Glucose-Capillary: 153 mg/dL — ABNORMAL HIGH (ref 70–99)
Glucose-Capillary: 155 mg/dL — ABNORMAL HIGH (ref 70–99)
Glucose-Capillary: 165 mg/dL — ABNORMAL HIGH (ref 70–99)
Glucose-Capillary: 166 mg/dL — ABNORMAL HIGH (ref 70–99)
Glucose-Capillary: 167 mg/dL — ABNORMAL HIGH (ref 70–99)
Glucose-Capillary: 168 mg/dL — ABNORMAL HIGH (ref 70–99)
Glucose-Capillary: 168 mg/dL — ABNORMAL HIGH (ref 70–99)
Glucose-Capillary: 173 mg/dL — ABNORMAL HIGH (ref 70–99)
Glucose-Capillary: 174 mg/dL — ABNORMAL HIGH (ref 70–99)
Glucose-Capillary: 179 mg/dL — ABNORMAL HIGH (ref 70–99)
Glucose-Capillary: 184 mg/dL — ABNORMAL HIGH (ref 70–99)
Glucose-Capillary: 194 mg/dL — ABNORMAL HIGH (ref 70–99)
Glucose-Capillary: 203 mg/dL — ABNORMAL HIGH (ref 70–99)

## 2021-06-29 LAB — CBC
HCT: 25.1 % — ABNORMAL LOW (ref 36.0–46.0)
Hemoglobin: 8.1 g/dL — ABNORMAL LOW (ref 12.0–15.0)
MCH: 28.3 pg (ref 26.0–34.0)
MCHC: 32.3 g/dL (ref 30.0–36.0)
MCV: 87.8 fL (ref 80.0–100.0)
Platelets: 275 10*3/uL (ref 150–400)
RBC: 2.86 MIL/uL — ABNORMAL LOW (ref 3.87–5.11)
RDW: 13.8 % (ref 11.5–15.5)
WBC: 25 10*3/uL — ABNORMAL HIGH (ref 4.0–10.5)
nRBC: 0.1 % (ref 0.0–0.2)

## 2021-06-29 LAB — TROPONIN I (HIGH SENSITIVITY)
Troponin I (High Sensitivity): 15 ng/L (ref <18)
Troponin I (High Sensitivity): 22 ng/L — ABNORMAL HIGH (ref <18)
Troponin I (High Sensitivity): 40 ng/L — ABNORMAL HIGH (ref <18)

## 2021-06-29 LAB — PROTIME-INR
INR: 1.1 (ref 0.8–1.2)
Prothrombin Time: 14.2 seconds (ref 11.4–15.2)

## 2021-06-29 LAB — APTT
aPTT: 32 seconds (ref 24–36)
aPTT: 30 seconds (ref 24–36)

## 2021-06-29 LAB — PREPARE RBC (CROSSMATCH)

## 2021-06-29 LAB — URINALYSIS, MICROSCOPIC (REFLEX)

## 2021-06-29 LAB — TRIGLYCERIDES: Triglycerides: 162 mg/dL — ABNORMAL HIGH (ref <150)

## 2021-06-29 LAB — CK TOTAL AND CKMB (NOT AT ARMC)
CK, MB: 10.7 ng/mL — ABNORMAL HIGH (ref 0.5–5.0)
Relative Index: 3.3 — ABNORMAL HIGH (ref 0.0–2.5)
Total CK: 329 U/L — ABNORMAL HIGH (ref 38–234)

## 2021-06-29 SURGERY — SURGICAL PROCUREMENT, ORGAN
Anesthesia: General | Site: Abdomen

## 2021-06-29 MED ORDER — ROCURONIUM BROMIDE 10 MG/ML (PF) SYRINGE
PREFILLED_SYRINGE | INTRAVENOUS | Status: DC | PRN
  Administered 2021-06-29: 100 mg via INTRAVENOUS

## 2021-06-29 MED ORDER — PIPERACILLIN-TAZOBACTAM 3.375 G IVPB 30 MIN: 3.3750 g | Freq: Three times a day (TID) | INTRAVENOUS | Status: DC

## 2021-06-29 MED ORDER — ROCURONIUM BROMIDE 10 MG/ML (PF) SYRINGE
PREFILLED_SYRINGE | INTRAVENOUS | Status: AC
  Filled 2021-06-29: qty 20

## 2021-06-29 MED ORDER — PHENYLEPHRINE HCL-NACL 20-0.9 MG/250ML-% IV SOLN
INTRAVENOUS | Status: DC | PRN
  Administered 2021-06-29: 20 ug/min via INTRAVENOUS

## 2021-06-29 MED ORDER — VANCOMYCIN HCL 1250 MG/250ML IV SOLN
1250.0000 mg | Freq: Once | INTRAVENOUS | Status: AC
  Administered 2021-06-29: 1250 mg via INTRAVENOUS
  Filled 2021-06-29: qty 250

## 2021-06-29 MED ORDER — HEPARIN SODIUM (PORCINE) 1000 UNIT/ML IJ SOLN
INTRAMUSCULAR | Status: DC | PRN
  Administered 2021-06-29: 18000 [IU] via INTRAVENOUS

## 2021-06-29 MED ORDER — PHENYLEPHRINE 40 MCG/ML (10ML) SYRINGE FOR IV PUSH (FOR BLOOD PRESSURE SUPPORT)
PREFILLED_SYRINGE | INTRAVENOUS | Status: AC
  Filled 2021-06-29: qty 30

## 2021-06-29 MED ORDER — SODIUM CHLORIDE 0.9% IV SOLUTION: Freq: Once | INTRAVENOUS | Status: AC

## 2021-06-29 MED ORDER — PROPOFOL 10 MG/ML IV BOLUS
INTRAVENOUS | Status: AC
  Filled 2021-06-29: qty 20

## 2021-06-29 MED ORDER — 0.9 % SODIUM CHLORIDE (POUR BTL) OPTIME
TOPICAL | Status: DC | PRN
  Administered 2021-06-29: 13000 mL

## 2021-06-29 MED ORDER — PHENYLEPHRINE HCL (PRESSORS) 10 MG/ML IV SOLN
INTRAVENOUS | Status: DC | PRN
  Administered 2021-06-29: 120 ug via INTRAVENOUS
  Administered 2021-06-29: 80 ug via INTRAVENOUS
  Administered 2021-06-29: 120 ug via INTRAVENOUS

## 2021-06-29 MED ORDER — VASOPRESSIN 20 UNIT/ML IV SOLN
INTRAVENOUS | Status: AC
  Filled 2021-06-29: qty 1

## 2021-06-29 MED ORDER — POTASSIUM PHOSPHATES 15 MMOLE/5ML IV SOLN
30.0000 mmol | Freq: Once | INTRAVENOUS | Status: AC
  Administered 2021-06-29: 30 mmol via INTRAVENOUS
  Filled 2021-06-29: qty 10

## 2021-06-29 SURGICAL SUPPLY — 91 items
APPLIER CLIP 11 MED OPEN (CLIP) ×3
APR CLP MED 11 20 MLT OPN (CLIP) ×1
BAG COUNTER SPONGE SURGICOUNT (BAG) ×2 IMPLANT
BAG SPNG CNTER NS LX DISP (BAG) ×1
BAG SURGICOUNT SPONGE COUNTING (BAG) ×1
BLADE CLIPPER SURG (BLADE) IMPLANT
BLADE SAW STERNAL (BLADE) ×3 IMPLANT
BLADE SURG 10 STRL SS (BLADE) IMPLANT
CLIP APPLIE 11 MED OPEN (CLIP) ×1 IMPLANT
CLIP VESOCCLUDE MED 24/CT (CLIP) IMPLANT
CLIP VESOCCLUDE SM WIDE 24/CT (CLIP) IMPLANT
CNTNR URN SCR LID CUP LEK RST (MISCELLANEOUS) ×1 IMPLANT
CONT SPEC 4OZ STRL OR WHT (MISCELLANEOUS) ×3
COVER BACK TABLE 60X90IN (DRAPES) IMPLANT
COVER MAYO STAND STRL (DRAPES) IMPLANT
COVER SURGICAL LIGHT HANDLE (MISCELLANEOUS) ×3 IMPLANT
DRAPE HALF SHEET 40X57 (DRAPES) IMPLANT
DRAPE SLUSH MACHINE 52X66 (DRAPES) ×5 IMPLANT
DRSG COVADERM 4X10 (GAUZE/BANDAGES/DRESSINGS) IMPLANT
DRSG COVADERM 4X14 (GAUZE/BANDAGES/DRESSINGS) ×2 IMPLANT
DRSG TELFA 3X8 NADH (GAUZE/BANDAGES/DRESSINGS) ×3 IMPLANT
DURAPREP 26ML APPLICATOR (WOUND CARE) IMPLANT
ELECT BLADE 6.5 EXT (BLADE) IMPLANT
ELECT REM PT RETURN 9FT ADLT (ELECTROSURGICAL) ×6
ELECTRODE REM PT RTRN 9FT ADLT (ELECTROSURGICAL) ×2 IMPLANT
GAUZE 4X4 16PLY ~~LOC~~+RFID DBL (SPONGE) IMPLANT
GLOVE SRG 8 PF TXTR STRL LF DI (GLOVE) IMPLANT
GLOVE SURG ENC MOIS LTX SZ7 (GLOVE) IMPLANT
GLOVE SURG ENC MOIS LTX SZ7.5 (GLOVE) IMPLANT
GLOVE SURG ENC MOIS LTX SZ8 (GLOVE) IMPLANT
GLOVE SURG ENC MOIS LTX SZ8.5 (GLOVE) IMPLANT
GLOVE SURG POLYISO LF SZ7 (GLOVE) IMPLANT
GLOVE SURG POLYISO LF SZ7.5 (GLOVE) IMPLANT
GLOVE SURG POLYISO LF SZ8 (GLOVE) IMPLANT
GLOVE SURG UNDER POLY LF SZ7 (GLOVE) IMPLANT
GLOVE SURG UNDER POLY LF SZ7.5 (GLOVE) IMPLANT
GLOVE SURG UNDER POLY LF SZ8 (GLOVE)
GLOVE SURG UNDER POLY LF SZ8.5 (GLOVE) IMPLANT
GOWN STRL REUS W/ TWL LRG LVL3 (GOWN DISPOSABLE) ×4 IMPLANT
GOWN STRL REUS W/ TWL XL LVL3 (GOWN DISPOSABLE) ×2 IMPLANT
GOWN STRL REUS W/TWL LRG LVL3 (GOWN DISPOSABLE) ×12
GOWN STRL REUS W/TWL XL LVL3 (GOWN DISPOSABLE) ×6
HANDLE SUCTION POOLE (INSTRUMENTS) IMPLANT
KIT POST MORTEM ADULT 36X90 (BAG) ×3 IMPLANT
KIT TURNOVER KIT B (KITS) ×3 IMPLANT
LOOP VESSEL MAXI BLUE (MISCELLANEOUS) ×2 IMPLANT
LOOP VESSEL MINI RED (MISCELLANEOUS) ×2 IMPLANT
MANIFOLD NEPTUNE II (INSTRUMENTS) ×3 IMPLANT
NDL BIOPSY 14X6 SOFT TISS (NEEDLE) IMPLANT
NEEDLE BIOPSY 14X6 SOFT TISS (NEEDLE) IMPLANT
NS IRRIG 1000ML POUR BTL (IV SOLUTION) ×18 IMPLANT
PACK AORTA (CUSTOM PROCEDURE TRAY) ×3 IMPLANT
PAD ARMBOARD 7.5X6 YLW CONV (MISCELLANEOUS) ×6 IMPLANT
PAD DRESSING TELFA 3X8 NADH (GAUZE/BANDAGES/DRESSINGS) ×1 IMPLANT
PENCIL BUTTON HOLSTER BLD 10FT (ELECTRODE) ×3 IMPLANT
SOL PREP POV-IOD 4OZ 10% (MISCELLANEOUS) ×6 IMPLANT
SPONGE INTESTINAL PEANUT (DISPOSABLE) IMPLANT
SPONGE T-LAP 18X18 ~~LOC~~+RFID (SPONGE) ×2 IMPLANT
STAPLER VISISTAT 35W (STAPLE) ×3 IMPLANT
SUCTION POOLE HANDLE (INSTRUMENTS)
SUT BONE WAX W31G (SUTURE) IMPLANT
SUT CHROMIC 3 0 SH 27 (SUTURE) ×2 IMPLANT
SUT ETHIBOND 5 LR DA (SUTURE) IMPLANT
SUT ETHILON 1 LR 30 (SUTURE) ×6 IMPLANT
SUT ETHILON 2 0 PSLX (SUTURE) ×4 IMPLANT
SUT ETHILON 2 LR (SUTURE) ×4 IMPLANT
SUT PROLENE 3 0 RB 1 (SUTURE) IMPLANT
SUT PROLENE 3 0 SH 1 (SUTURE) IMPLANT
SUT PROLENE 4 0 RB 1 (SUTURE) ×6
SUT PROLENE 4-0 RB1 .5 CRCL 36 (SUTURE) IMPLANT
SUT PROLENE 5 0 C 1 24 (SUTURE) IMPLANT
SUT PROLENE 6 0 BV (SUTURE) IMPLANT
SUT SILK 0 TIES 10X30 (SUTURE) IMPLANT
SUT SILK 1 SH (SUTURE) IMPLANT
SUT SILK 1 TIES 10X30 (SUTURE) IMPLANT
SUT SILK 2 0 (SUTURE)
SUT SILK 2 0 SH (SUTURE) IMPLANT
SUT SILK 2 0 SH CR/8 (SUTURE) ×2 IMPLANT
SUT SILK 2 0 TIES 10X30 (SUTURE) IMPLANT
SUT SILK 2-0 18XBRD TIE 12 (SUTURE) IMPLANT
SUT SILK 3 0 SH CR/8 (SUTURE) IMPLANT
SUT SILK 3 0 TIES 10X30 (SUTURE) IMPLANT
SWAB COLLECTION DEVICE MRSA (MISCELLANEOUS) IMPLANT
SWAB CULTURE ESWAB REG 1ML (MISCELLANEOUS) IMPLANT
SYR 50ML LL SCALE MARK (SYRINGE) IMPLANT
SYRINGE TOOMEY DISP (SYRINGE) IMPLANT
TAPE UMBILICAL 1/8 X36 TWILL (MISCELLANEOUS) IMPLANT
TUBE CONNECTING 12'X1/4 (SUCTIONS) ×1
TUBE CONNECTING 12X1/4 (SUCTIONS) ×2 IMPLANT
WATER STERILE IRR 1000ML POUR (IV SOLUTION) IMPLANT
YANKAUER SUCT BULB TIP NO VENT (SUCTIONS) ×3 IMPLANT

## 2021-06-30 ENCOUNTER — Encounter (HOSPITAL_COMMUNITY): Payer: Self-pay

## 2021-06-30 LAB — CULTURE, RESPIRATORY W GRAM STAIN

## 2021-06-30 LAB — BPAM RBC
Blood Product Expiration Date: 202212282359
Blood Product Expiration Date: 202212292359
Blood Product Expiration Date: 202212302359
Blood Product Expiration Date: 202212302359
ISSUE DATE / TIME: 202212130856
Unit Type and Rh: 6200
Unit Type and Rh: 6200
Unit Type and Rh: 6200
Unit Type and Rh: 6200

## 2021-06-30 LAB — TYPE AND SCREEN
ABO/RH(D): A POS
Antibody Screen: NEGATIVE
PT AG Type: POSITIVE
Unit division: 0
Unit division: 0
Unit division: 0
Unit division: 0

## 2021-06-30 LAB — CULTURE, BLOOD (ROUTINE X 2): Special Requests: ADEQUATE

## 2021-07-01 LAB — SURGICAL PATHOLOGY

## 2021-07-03 LAB — CULTURE, BLOOD (ROUTINE X 2)
Culture: NO GROWTH
Special Requests: ADEQUATE

## 2021-07-18 NOTE — Death Summary Note (Signed)
DEATH SUMMARY   Patient Details  Name: Kelly Gillespie MRN: [id number] DOB: 08-Dec-1985  Admission/Discharge Information   Admit Date:  07-13-21  Date of Death: Date of Death: 07/22/21  Time of Death: Time of Death: 15-Dec-1415  Length of Stay: 2022/12/19  Referring Physician: Patient, No Pcp Per (Inactive)   Reason(s) for Hospitalization  Subarachnoid Hemorrhage  Diagnoses  Preliminary cause of death:  Secondary Diagnoses (including complications and co-morbidities):  Principal Problem:   Subarachnoid bleed [facility])   Brief Hospital Course (including significant findings, care, treatment, and services provided and events leading to death)  Kelly Gillespie is a 36 y.o. year old female who Initially presented to the hospital with altered mental status.  Her workup included CT scan demonstrating diffuse subarachnoid hemorrhage.  She did require intubation for airway protection.  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   She was treated with dual anti-platelet therapy and the following day she underwent pipeline embolization of the blister aneurysm.  She was found to have an aspiration pneumonia and was unable to be extubated however her neurologic exam remained reasonably stable until Friday 12/9  when she had relatively rapid neurologic decline.  CT angiogram revealed severe diffuse vasospasm.  Attempts were made to augment her blood pressure however she had continued neurologic decline.  Ultimately, her neurologic exam was consistent with brain death and formal brain death exam was performed and she was pronounced dead by neurologic criteria.    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  Abnormal  transcranial Doppler. EXAM: CT ANGIOGRAPHY HEAD TECHNIQUE: Multidetector CT imaging of the head was performed using the standard protocol during bolus administration of intravenous contrast. Multiplanar CT image reconstructions and MIPs were obtained to evaluate the vascular anatomy. CONTRAST:  OMNIPAQUE IOHEXOL 350 MG/ML SOLN COMPARISON:  Plain head CT 0517 hours today. CTA head and neck Jul 13, 2021. FINDINGS: CT HEAD Brain: New broad-based area of lost gray-white matter differentiation in the posterior right MCA territory since 0517 hours today on series 4, image 21. Area of involvement approximately 5.5 cm. Stable superimposed intraventricular hemorrhage, ventriculomegaly, and gray-white matter differentiation elsewhere. However, there is increased subarachnoid hemorrhage in the right sylvian fissure now tracking posteriorly. Intra-axial hemorrhage and confluent hypodense edema in the anterior right temporal lobe appears stable. Stable midbrain intra-axial hemorrhage paired near the midline. Stable right EVD. No midline shift. Basilar cisterns not significantly changed. Calvarium and skull base: Stable right superior burr hole. Paranasal sinuses: Stable. Mild mastoid effusion in addition to a paranasal sinus fluid and mucosal thickening. Orbits: Intubated, fluid in the pharynx. Visualized orbit soft tissues are within normal limits. No acute scalp soft tissue finding. CTA HEAD Below the skull base cervical ICA enhancement is intense and appears well time similar to that on 07/13/21. However, there is distinctly decreased generalized intracranial arterial enhancement, which appears especially poor in the distal vertebral arteries and proximal basilar. Additionally: Posterior circulation: Distal cervical vertebral arteries are enhancing and patent with dominance of the left V3 segment as before. However, there is poor enhancement of now highly diminutive appearing bilateral vertebral V4 segments and  vertebrobasilar junction. Pipeline stent from the distal basilar through the right PCA remains in place, but there is only poor enhancement of the PCA  branches now, somewhat asymmetrically better on the right. See series 13, image 22. Anterior circulation: Likewise, both ICA siphons appear patent but the supraclinoid segments are now highly attenuated, carotid termini are diminutive and irregular (series 14, image 18). Fairly symmetric attenuated appearance of the bilateral M1 and A1 segments. Similarly irregular and attenuated bilateral ACA and MCA branches. Right MCA branches appear most irregular, indistinct (series 14, image 19). Venous sinuses: Not evaluated, little to no intravenous enhancement. Anatomic variants: Dominant cervical left vertebral. Review of the MIP images confirms the above findings IMPRESSION: 1. Substantially attenuated enhancement of intracranial arteries diffusely when compared to the CTA on 07/07/2021. Flow in the posterior circulation appears particularly poor, despite stable appearing enhancement of the extracranial carotids and vertebral arteries. Differential considerations include diffuse Severe Vaso-spasm and/or sequelae of worsening intracranial pressure. 2. Right MCA branches appear most irregular, and there is a new broad-based roughly 5.5 cm area of cytotoxic edema in the posterior right MCA territory. 3. At the same time, right sylvian fissure SAH has increased since 0517 hours today. But no midline shift, and basilar cisterns are stable. 4. Other intracranial findings including IVH, residual ventriculomegaly, right anterior temporal lobe and midbrain intra-axial hemorrhage appears stable from earlier today. Study discussed by telephone with Dr. Monia Pouch on 06/26/2021 at 11:50. We discussed the differential considerations and severe diffuse vasospasm is favored in light of the clinical factors and EVD which he reports is functioning as expected. Electronically Signed   By: Odessa Fleming M.D.   On: 06/26/2021 11:57   CT HEAD WO CONTRAST ( )  Addendum Date: 06/26/2021   ADDENDUM REPORT: 06/26/2021 05:47 ADDENDUM: Study discussed by telephone with Dr. Henry Russel on 06/26/2021 at 0543 hours. Electronically Signed   By: Odessa Fleming M.D.   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 Change in pupil reactivity. EXAM: CT HEAD WITHOUT CONTRAST TECHNIQUE: Contiguous axial images were obtained from the base of the skull through the vertex without intravenous contrast. COMPARISON:  Head CT 07/02/2021 and earlier. FINDINGS: Brain: Right vertex approach EVD remains in place, terminating at the anterior septum pellucidum at the right frontal horn. But intraventricular hemorrhage has increased since 07/07/2021 and is now moderate. Mild to moderate ventriculomegaly is stable. Bilateral subarachnoid hemorrhage has regressed, pedicular early in the basilar cisterns, but SAH has not resolved. There is combined confluent cytotoxic edema and intra-axial hemorrhage in the right anterior temporal lobe which is more apparent (series 3, image 11). Furthermore, focal intra-axial symmetric midbrain hemorrhage persists (series 3, image 12). There is no obvious brainstem edema or enlargement. Basilar cistern patency has improved. There is no midline shift. No uncal or other intracranial herniation. Outside of the anterior right temporal lobe gray-white matter differentiation remains within normal limits. Vascular: Pipeline stent visible from the basilar artery through the right PCA origin. Skull: Stable right vertex burr hole for EVD. No acute osseous abnormality identified. Sinuses/Orbits: Intubated on the scout view. Mild sinus fluid and mucosal thickening. Tympanic cavities and mastoids remain clear. Other: Fluid in the pharynx in the setting of intubation. Stable  postoperative changes to the right scalp vertex related to EVD. Visualized orbit soft tissues are within normal limits. IMPRESSION: 1. Right side EVD remains in place but intraventricular hemorrhage has increased since 06/24/2021 and mild to moderate ventriculomegaly has not significantly changed. 2. Confluent cytotoxic edema and small volume intra-axial  hemorrhage in the right anterior temporal lobe is more apparent. Unclear whether this is the direct sequelae of the aneurysm rupture, or due to subsequent ischemia. 3. Superimposed intra-axial midbrain hemorrhage persists, stable. 4. No midline shift, and no uncal or other intracranial herniation. Basilar cistern patency has improved along with regression of SAH since 07/16/2021. Electronically Signed: By: Odessa Fleming M.D. On: 06/26/2021 05:38   CT HEAD WO CONTRAST ( )  Result Date: 06/18/2021 CLINICAL DATA:  Subarachnoid hemorrhage follow-up EXAM: CT HEAD WITHOUT CONTRAST TECHNIQUE: Contiguous axial images were obtained from the base of the skull through the vertex without intravenous contrast. COMPARISON:  Earlier same day FINDINGS: Brain: Subarachnoid hemorrhage is again identified centered within the basal cisterns and extending into the sylvian fissures, interhemispheric fissure, and scattered cortical sulci as well as inferiorly along the brainstem. There has been recirculation with greater hemorrhage now present within the ventricles. Persistent hemorrhage within the midbrain. Interval placement of right frontal approach ventricular catheter with tip directed into the right frontal horn and inferiorly. Decreased hydrocephalus. No new loss of gray-white differentiation. Vascular: No hyperdense vessel or unexpected calcification.There is atherosclerotic calcification at the skull base. Skull: Calvarium is unremarkable. Sinuses/Orbits: No acute finding. Other: None. IMPRESSION: Similar large volume subarachnoid hemorrhage centered in the basal cisterns with  some interval recirculation. Unchanged midbrain hemorrhage. Interval ventricular catheter placement with persistent but decreased hydrocephalus. Electronically Signed   By: Guadlupe Spanish M.D.   On: 07/12/2021 17:08   CT Angio Chest Pulmonary Embolism (PE) W or WO Contrast  Result Date: 06/24/2021 CLINICAL DATA:  36 year old female with history of subarachnoid hemorrhage. Anoxic brain injury. Suspected pulmonary embolism. EXAM: CT ANGIOGRAPHY CHEST WITH CONTRAST TECHNIQUE: Multidetector CT imaging of the chest was performed using the standard protocol during bolus administration of intravenous contrast. Multiplanar CT image reconstructions and MIPs were obtained to evaluate the vascular anatomy. CONTRAST:  60mL OMNIPAQUE IOHEXOL 350 MG/ML SOLN COMPARISON:  No priors. FINDINGS: Cardiovascular: In the right upper lobe (axial image 74 of series 6) there is a small central nonocclusive filling defect within a subsegmental sized branch to the right upper lobe, compatible with pulmonary embolism. No larger central, lobar or segmental sized pulmonary embolism is noted. Heart size is normal. There is no significant pericardial fluid, thickening or pericardial calcification. No atherosclerotic calcifications in the thoracic aorta or the coronary arteries. Mediastinum/Nodes: Patient is intubated, with the tip of the endotracheal tube lying approximally 1.9 cm above the carina. No pathologically enlarged mediastinal or hilar lymph nodes. Nasogastric tube extending into the stomach. Esophagus is otherwise unremarkable in appearance. No axillary lymphadenopathy. Lungs/Pleura: Extensive airspace consolidation is noted in the dependent portions of the lower lobes of the lungs bilaterally, suggesting aspiration pneumonia. Similar findings are present to a lesser extent in the dependent portion of the right upper lobe as well. No pleural effusions. No definite suspicious appearing pulmonary nodules or masses are noted. Upper  Abdomen: 1.0 x 0.5 cm low-attenuation lesion in segment 4A of the liver, compatible with a simple cyst. Musculoskeletal: There are no aggressive appearing lytic or blastic lesions noted in the visualized portions of the skeleton. Review of the MIP images confirms the above findings. IMPRESSION: 1. Study is positive for a tiny subsegmental sized right upper lobe pulmonary embolism. 2. Findings are concerning for severe bilateral aspiration pneumonia, as above. These results will be called to the ordering clinician or representative by the Radiologist Assistant, and communication documented in the PACS or [employer]. Electronically Signed   By:  Trudie Reed M.D.   On: 06/24/2021 11:33   IR Intra Cran Stent  Result Date: 07/17/2021 PROCEDURE: PIPELINE EMBOLIZATION OF RIGHT POSTERIOR CEREBRAL ARTERY ANEURYSM HISTORY: The patient is a 36 year old woman initially presenting to the emergency department obtunded with CT scan demonstrating subarachnoid hemorrhage. External ventricular drain was placed. Angiogram done yesterday demonstrated a blister type aneurysm of the right P1 not amenable to direct coil embolization. Patient was loaded with Brilinta and aspirin after follow-up CT demonstrated stability of her hemorrhage and good placement of the EVD. Patient therefore presents today for pipeline embolization of the aneurysm. ACCESS: The technical aspects of the procedure as well as its potential risks and benefits were reviewed with the patient's family. These risks included but were not limited to stroke, intracranial hemorrhage, bleeding, infection, allergic reaction, damage to organs or vital structures, stroke, non-diagnostic procedure, and the catastrophic outcomes of heart attack, coma, and death. With an understanding of these risks, informed consent was obtained and witnessed. The patient was placed in the supine position on the angiography table and the skin of right groin prepped in the usual  sterile fashion. The procedure was performed under general anesthesia. The previously placed 5 French sheath was checked for patency and replaced with an 8 [place] sheath over a Glidewire. MEDICATIONS: HEPARIN: 0 Units total. CONTRAST:  cc, Omnipaque 300 FLUOROSCOPY TIME:  FLUOROSCOPY TIME: See IR records TECHNIQUE: CATHETERS AND WIRES 5-French JB-1 catheter 180 cm 0.035" glidewire 6-French NeuronMax guide sheath 5-French Berenstein Select JB-1 catheter 0.058" CatV guidecatheter 150 cm Phenom 27 microcatheter Synchro 2 select microwire PIPELINE DEVICE USED Pipeline Flex 3.25 x 16 Pipeline Flex 3.5 x 16 (not deployed) VESSELS CATHETERIZED Left vertebral Right posterior cerebral artery, P2 segment Right common femoral VESSELS STUDIED Left vertebral artery, head (pre embolization) Left vertebral artery, head (immediate post-embolization) Left vertebral artery, head (delayed post embolization) Left vertebral artery, head (final control) Right common femoral PROCEDURAL NARRATIVE Under real-time fluoroscopy, the guide sheath was advanced over the select catheter and glidewire into the descending aorta. The select catheter was then advanced into the mid cervical left vertebral artery over the Glidewire. The guide sheath was then advanced into the origin of the left vertebral artery. The Select catheter was removed and the 058 guide catheter was coaxially introduced over the microcatheter and microwire. The microcatheter was then advanced under roadmap guidance into the basilar artery. The guide catheter was then tracked over the microcatheter to its final position in the distal left vertebral artery, just proximal to the vertebrobasilar junction. The microcatheter was then advanced over the microwire into the P2 segment of the right posterior cerebral artery. Microwire was then removed and the 3.5 mm device was introduced. Device was then deployed extending from the right P1 over the aneurysm. It became clear that the  device was oversized and the proximal landing would be quite proximal in the basilar artery. I therefore elected to recapture the device and replace it for a smaller 1. The microcatheter was advanced over the pushing wire and the 3.5 mm device was recaptured and withdrawn which deployed in the hub of the 27 catheter. This was easily removed. The 3.25 mm device was then introduced and again advanced into the right posterior cerebral artery. The device was then carefully deployed extending from the right P1 P2 junction, across the aneurysm, into the distal basilar artery. The 27 microcatheter was then advanced over the pushing wire into the lumen of the stent and the pusher wire was recaptured and  removed. Angiogram was taken demonstrating likely catheter-related spasm of the P2 segment. There appeared to be good apposition of the stent. I therefore elected to remove the 27 microcatheter. Repeat angiogram now demonstrated patency of the right posterior cerebral artery. Delayed angiogram was again taken again demonstrating good patency. The guide catheter and guide sheath were withdrawn, with the tip of the guide catheter in the cervical vertebral artery. Final control angiogram was taken. The guide catheter and guide sheath were then synchronously removed without incident. FINDINGS: Left vertebral artery (pre embolization): The left vertebral artery, basilar artery, and basilar bifurcation are all widely patent. The previously described blister type aneurysm projecting inferiorly is again seen in the proximal right P1. Capillary phase does not demonstrate any perfusion deficits. Venous sinuses are patent. Left vertebral artery (immediate post embolization) Injection demonstrates widely patent left vertebral artery and basilar artery as well as widely patent left posterior cerebral artery. Pipeline stent is deployed extending from the P1 P2 junction on the right into the distal basilar artery. There appears to be good  vessel wall apposition. The 27 microcatheter remains within the distal segment of the posterior cerebral artery. Distal segments of the posterior cerebral artery are therefore not well visualized likely related to combination of catheter-related vasospasm and occlusive nature of the 27 catheter. Subsequent injection after removal of the 27 catheter reveals patency of the distal segments of the right posterior cerebral artery. There is mild likely catheter-related vasospasm of the P2 segment. There is no contrast stasis or branch occlusions noted. Left vertebral artery (delayed post embolization) Injection again demonstrates a widely patent left vertebral artery, basilar artery, and basilar bifurcation. Pipeline stent is in stable position. There is significant contrast stasis within the aneurysm, extending well into the venous phase. There is slightly improved spasm of the P2 segment of the posterior cerebral artery. The distal segments of the posterior cerebral artery remain widely patent without any branch occlusions noted. Left vertebral artery (final control) Injection reveals widely patent vertebral artery and basilar artery. Bilateral posterior cerebral arteries remain widely patent as are the superior cerebellar arteries. Pipeline stent is in stable position. Capillary phase does not demonstrate any perfusion deficits. No branch occlusions are noted. Venous sinuses are patent. Right femoral: Normal vessel. No significant atherosclerotic disease. Arterial sheath in adequate position. DISPOSITION: Upon completion of the study, the femoral sheath was removed and hemostasis obtained using a 8-Fr AngioSeal closure device. Good proximal and distal lower extremity pulses were documented upon achievement of hemostasis. The procedure was well tolerated and no early complications were observed. The patient was transferred to the intensive care unit in stable hemodynamic condition. IMPRESSION: 1. Successful pipeline  embolization of a blister-type right P1 aneurysm as described above The preliminary results of this procedure were shared with the patient's family. Electronically Signed   By: Lisbeth Renshaw   On: 07/14/2021 12:14   IR 3D Independent Annabell Sabal  Result Date: 06/26/2021 PROCEDURE: DIAGNOSTIC CEREBRAL ANGIOGRAM HISTORY: The patient is a 36 year old woman presenting to the hospital with altered mental status requiring intubation. Workup included CT scan demonstrating diffuse basal subarachnoid hemorrhage and hydrocephalus. She underwent placement of a ventriculostomy in the intensive care unit. 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 Patient therefore presents today for diagnostic cerebral angiogram and possible endovascular treatment. ACCESS: The technical aspects of the procedure as well as its potential risks and benefits were reviewed with the patient. These risks included but were not limited bleeding,  infection, allergic reaction, damage to organs or vital structures, stroke, non-diagnostic procedure, and the catastrophic outcomes of heart attack, coma, and death. With an understanding of these risks, informed consent was obtained and witnessed. The patient was placed in the supine position on the angiography table and the skin of right groin prepped in the usual sterile fashion. The procedure was performed under general anesthesia monitored by the anesthesia service. A 5- French sheath was introduced in the right common femoral artery using Seldinger technique. A fluoro-phase sequence was used to document the sheath position. MEDICATIONS: HEPARIN: 0 Units total. CONTRAST:  14mL OMNIPAQUE IOHEXOL 300 MG/ML SOLN, 50mL OMNIPAQUE IOHEXOL 300 MG/ML SOLN, 15mL OMNIPAQUE IOHEXOL 300 MG/ML SOLNcc, Omnipaque 300 FLUOROSCOPY TIME:  FLUOROSCOPY TIME: See IR records TECHNIQUE: CATHETERS AND WIRES 5-French JB-1 catheter 0.035" glidewire VESSELS CATHETERIZED Right internal  carotid Left internal carotid Left vertebral Right vertebral Right common femoral VESSELS STUDIED Right internal carotid, head Left internal carotid, head Left vertebral Left vertebral, 3D rotation Right vertebral PROCEDURAL NARRATIVE A 5-Fr JB-1 catheter was advanced over a 0.035 glidewire into the aortic arch. The above vessels were then sequentially catheterized and cervical / cerebral angiograms taken. After review of images, the catheter was removed without incident. FINDINGS: Right internal carotid, head: Injection reveals the presence of a widely patent ICA, M1, and A1 segments and their branches. No aneurysms, AVMs, or high-flow fistulas are seen. Of note, there is a patent posterior communicating artery with flash filling of the right PCA. The parenchymal and venous phases are normal. The venous sinuses are widely patent. Left internal carotid, head: Injection reveals the presence of a widely patent ICA, A1, and M1 segments and their branches. No aneurysms, AVMs, or high-flow fistulas are seen. The parenchymal and venous phases are normal. The venous sinuses are widely patent. Left vertebral: Injection reveals the presence of a widely patent vertebral artery. This leads to a widely patent basilar artery that terminates in bilateral P1. The basilar apex is normal however there is an inferiorly projecting blister type aneurysm arising from the proximal right P1. This aneurysm is better delineated on the three-dimensional rotational angiogram. The parenchymal and venous phases are normal. The venous sinuses are widely patent. Right vertebral: The vertebral artery is widely patent. No PICA aneurysm is seen. See basilar description above. Left vertebral, 3D rotation: Three-dimensional rotational images were acquired and transferred to an independent work station for reconstruction and review. These reconstructed images further delineate the above-described right PCA aneurysm. Aneurysm is blister shaped,  projecting inferiorly from the proximal right P1. The aneurysm appears to measure approximately 3.3 mm wide and approximally 1.3 mm tall. DISPOSITION: Upon completion of the study, the femoral sheath was secured in place and connected to continuous heparinized saline flush. The procedure was well tolerated and no early complications were observed. The patient was transferred back to the intensive care unit for further care. IMPRESSION: 1. 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 This aneurysm is not amenable to primary coil embolization. The preliminary results of this procedure were shared with the patient and the patient's family. Electronically Signed   By: Lisbeth Renshaw   On: 07/06/2021 10:50   DG Chest Port 1 View  Result Date: 06/27/2021 CLINICAL DATA:  Respiratory weaning. EXAM: PORTABLE CHEST 1 VIEW COMPARISON:  June 26, 2021. FINDINGS: The heart size and mediastinal contours are within normal limits. Endotracheal and nasogastric tubes are unchanged in position. Left internal jugular catheter is  unchanged in position. Both lungs are clear. The visualized skeletal structures are unremarkable. IMPRESSION: Stable support apparatus.  No acute abnormality is seen. Electronically Signed   By: Lupita Raider M.D.   On: 06/27/2021 07:55   DG CHEST PORT 1 VIEW  Result Date: 06/26/2021 CLINICAL DATA:  Intracranial hemorrhage is EXAM: PORTABLE CHEST 1 VIEW COMPARISON:  Radiograph 06/24/2021 FINDINGS: Endotracheal tube NG tube unchanged. Placement of LEFT central venous line with tip in the RIGHT atrium/cavoatrial junction. No pneumothorax. Lungs are clear. IMPRESSION: 1. Placement of central venous line with tip in the RIGHT atrium/cavoatrial junction 2. No pneumothorax.  Lungs are clear. Electronically Signed   By: Genevive Bi M.D.   On: 06/26/2021 13:34   DG CHEST PORT 1 VIEW  Result Date: 06/24/2021 CLINICAL DATA:  ET tube EXAM:  PORTABLE CHEST 1 VIEW COMPARISON:  06/21/2021 FINDINGS: Endotracheal tube and NG tube remain in place, unchanged. Increasing left basilar airspace opacity. Some improvement in the right basilar/infrahilar opacity. No effusions. Heart is normal size. No acute bony abnormality. IMPRESSION: Improving right infrahilar airspace disease with increasing left basilar atelectasis or infiltrate. Electronically Signed   By: Charlett Nose M.D.   On: 06/24/2021 08:09   DG CHEST PORT 1 VIEW  Result Date: 06/21/2021 CLINICAL DATA:  Subarachnoid bleed, oxygen desaturation. EXAM: PORTABLE CHEST 1 VIEW COMPARISON:  06/21/2021. FINDINGS: Endotracheal tube terminates approximately 1.8 cm above the carina. Right IJ central line tip is at the SVC RA junction or high right atrium. Nasogastric tube is followed into the stomach with the tip projecting beyond the inferior margin of the image. Heart size normal. Right infrahilar airspace opacification has increased from 06/21/2021 at 0639 hours. Probable streaky atelectasis in the left lower lobe. Lungs are otherwise clear. No pleural fluid. IMPRESSION: Increasing right infrahilar opacification may be due to aspiration. Electronically Signed   By: Leanna Battles M.D.   On: 06/21/2021 12:57   DG Chest Port 1 View  Result Date: 06/21/2021 CLINICAL DATA:  Respiratory failure EXAM: PORTABLE CHEST 1 VIEW COMPARISON:  Chest radiograph 06/30/2021 FINDINGS: The endotracheal tube tip is approximately 2.5 cm from the carina. The enteric catheter tip and sidehole project over the stomach. There is a new right IJ central venous catheter with the tip at the right atrium. The cardiomediastinal silhouette is stable. There are increased opacities in the medial right lower lobe. The remainder of the lungs are clear. There is no pleural effusion or pneumothorax. IMPRESSION: 1. Support devices as above. 2. Increased opacities in the medial right lower lobe could reflect developing infection, aspiration,  or atelectasis. Electronically Signed   By: Lesia Hausen M.D.   On: 06/21/2021 08:45   DG Chest Portable 1 View  Result Date: 07/12/2021 CLINICAL DATA:  Post intubation EXAM: PORTABLE CHEST 1 VIEW COMPARISON:  None. FINDINGS: Endotracheal tube terminates 3 cm above the carina. Lungs are clear.  No pleural effusion or pneumothorax. Heart is normal in size. IMPRESSION: Endotracheal tube terminates 3 cm above the carina. Electronically Signed   By: Charline Bills M.D.   On: 07/02/2021 02:31   DG Abd Portable 1V  Result Date: 07/16/2021 CLINICAL DATA:  OG tube placement EXAM: PORTABLE ABDOMEN - 1 VIEW COMPARISON:  None. FINDINGS: Enteric tube appears adequately positioned in the stomach. Overall bowel gas pattern is nonobstructive. Lung bases appear clear. IMPRESSION: Enteric tube appears adequately positioned in the stomach. Electronically Signed   By: Bary Richard M.D.   On: 06/18/2021 15:04   ECHOCARDIOGRAM COMPLETE  Result Date: 07/24/21    ECHOCARDIOGRAM REPORT   Patient Name:   LORYN HAACKE Date of Exam: 07/24/2021 Medical Rec #:  [id number]        Height:       62.0 in Accession #:    [id number]       Weight:       128.1 lb Date of Birth:  05-02-1986         BSA:          1.582 m Patient Age:    35 years         BP:           90/53 mmHg Patient Gender: F                HR:           92 bpm. Exam Location:  Inpatient Procedure: 2D Echo, Cardiac Doppler and Color Doppler Indications:    Organ donor. Cerebral bleed. Acute respiratory distress.  History:        Patient has no prior history of Echocardiogram examinations.  Sonographer:    Margreta Journey RDCS Referring Phys: [phone] WHITNEY D HARRIS IMPRESSIONS  1. Left ventricular ejection fraction, by estimation, is 60 to 65%. The left ventricle has normal function. Left ventricular endocardial border not optimally defined to evaluate regional wall motion. Left ventricular diastolic parameters were normal.  2. Right ventricular systolic function  is normal. The right ventricular size is normal. Tricuspid regurgitation signal is inadequate for assessing PA pressure.  3. The mitral valve is grossly normal. No evidence of mitral valve regurgitation.  4. The aortic valve is normal in structure. Aortic valve regurgitation is not visualized. No aortic stenosis is present.  5. The inferior vena cava is normal in size with <50% respiratory variability, suggesting right atrial pressure of 8 mmHg. Comparison(s): No prior Echocardiogram. FINDINGS  Left Ventricle: Left ventricular ejection fraction, by estimation, is 60 to 65%. The left ventricle has normal function. Left ventricular endocardial border not optimally defined to evaluate regional wall motion. The left ventricular internal cavity size was normal in size. There is no left ventricular hypertrophy. Left ventricular diastolic parameters were normal. Right Ventricle: The right ventricular size is normal. No increase in right ventricular wall thickness. Right ventricular systolic function is normal. Tricuspid regurgitation signal is inadequate for assessing PA pressure. Left Atrium: Left atrial size was normal in size. Right Atrium: Right atrial size was normal in size. Pericardium: There is no evidence of pericardial effusion. Mitral Valve: The mitral valve is grossly normal. No evidence of mitral valve regurgitation. Tricuspid Valve: The tricuspid valve is grossly normal. Tricuspid valve regurgitation is not demonstrated. Aortic Valve: The aortic valve is normal in structure. Aortic valve regurgitation is not visualized. No aortic stenosis is present. Pulmonic Valve: The pulmonic valve was not well visualized. Pulmonic valve regurgitation is not visualized. Aorta: The aortic root and ascending aorta are structurally normal, with no evidence of dilitation. Venous: The inferior vena cava is normal in size with less than 50% respiratory variability, suggesting right atrial pressure of 8 mmHg. IAS/Shunts: No  atrial level shunt detected by color flow Doppler.  LEFT VENTRICLE PLAX 2D LVIDd:         4.00 cm Diastology LVIDs:         2.50 cm LV e' medial:    7.94 cm/s LV PW:         1.00 cm LV E/e' medial:  9.2 LV IVS:  0.90 cm LV e' lateral:   12.40 cm/s                        LV E/e' lateral: 5.9  RIGHT VENTRICLE RV Basal diam:  2.10 cm RV S prime:     11.40 cm/s TAPSE (M-mode): 2.4 cm LEFT ATRIUM             Index        RIGHT ATRIUM          Index LA diam:        2.70 cm 1.71 cm/m   RA Area:     9.63 cm LA Vol (A2C):   27.0 ml 17.07 ml/m  RA Volume:   20.00 ml 12.64 ml/m LA Vol (A4C):   18.6 ml 11.76 ml/m LA Biplane Vol: 22.8 ml 14.41 ml/m  AORTIC VALVE LVOT Vmax:   133.00 cm/s LVOT Vmean:  97.000 cm/s LVOT VTI:    0.197 m  AORTA Ao Root diam: 3.00 cm MITRAL VALVE MV Area (PHT): 2.48 cm    SHUNTS MV Decel Time: 306 msec    Systemic VTI: 0.20 m MV E velocity: 72.80 cm/s MV A velocity: 64.70 cm/s MV E/A ratio:  1.13 Photographer signed by Carolan Clines Signature Date/Time: 07/17/2021/5:15:47 PM    Final    CT HEAD CODE STROKE WO CONTRAST  Result Date: 06/25/2021 CLINICAL DATA:  Code stroke.  Headache and hypertension EXAM: CT HEAD WITHOUT CONTRAST TECHNIQUE: Contiguous axial images were obtained from the base of the skull through the vertex without intravenous contrast. COMPARISON:  None. FINDINGS: Brain: Large volume subarachnoid hemorrhage, concentrated in the basal cisterns. Small amount of intraventricular blood. Likely early hydrocephalus. No midline shift or other mass effect. Vascular: No abnormal hyperdensity of the major intracranial arteries or dural venous sinuses. No intracranial atherosclerosis. Skull: The visualized skull base, calvarium and extracranial soft tissues are normal. Sinuses/Orbits: No fluid levels or advanced mucosal thickening of the visualized paranasal sinuses. No mastoid or middle ear effusion. The orbits are normal. IMPRESSION: 1. Large volume subarachnoid  hemorrhage, concentrated in the basal cisterns. 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 2. Likely early hydrocephalus. Critical Value/emergent results were called by telephone at the time of interpretation on 06/21/2021 at 2:14 am to provider Milon Dikes, who verbally acknowledged these results. Electronically Signed   By: Deatra Robinson M.D.   On: 07/04/2021 02:15   VAS [place] LOWER EXTREMITY VENOUS (DVT)  Result Date: 06/27/2021  Lower Venous DVT Study Patient Name:  HANH KERTESZ  Date of Exam:   06/25/2021 Medical Rec #: [id number]         Accession #:    [id number] Date of Birth: 11-28-1985          Patient Gender: F Patient Age:   35 years Exam Location:  [facility] Procedure:      VAS [place] LOWER EXTREMITY VENOUS (DVT) Referring Phys: Durel Salts --------------------------------------------------------------------------------  Indications: Pulmonary embolism.  Comparison Study: no prior Performing Technologist: [place] Ponder RVS  Examination Guidelines: A complete evaluation includes B-mode imaging, spectral Doppler, color Doppler, and power Doppler as needed of all accessible portions of each vessel. Bilateral testing is considered an integral part of a complete examination. Limited examinations for reoccurring indications may be performed as noted. The reflux portion of the exam is performed with the patient in reverse Trendelenburg.  +---------+---------------+---------+-----------+----------+--------------+  RIGHT     Compressibility Phasicity Spontaneity Properties Thrombus Aging  +---------+---------------+---------+-----------+----------+--------------+  CFV       Full            Yes       Yes                                    +---------+---------------+---------+-----------+----------+--------------+  SFJ       Full                                                             +---------+---------------+---------+-----------+----------+--------------+  FV Prox   Full                                                              +---------+---------------+---------+-----------+----------+--------------+  FV Mid    Full                                                             +---------+---------------+---------+-----------+----------+--------------+  FV Distal Full                                                             +---------+---------------+---------+-----------+----------+--------------+  PFV       Full                                                             +---------+---------------+---------+-----------+----------+--------------+  POP       Full            Yes       Yes                                    +---------+---------------+---------+-----------+----------+--------------+  PTV       Full                                                             +---------+---------------+---------+-----------+----------+--------------+  PERO      Full                                                             +---------+---------------+---------+-----------+----------+--------------+   +---------+---------------+---------+-----------+----------+--------------+  LEFT      Compressibility Phasicity Spontaneity Properties Thrombus Aging  +---------+---------------+---------+-----------+----------+--------------+  CFV       Full            Yes       Yes                                    +---------+---------------+---------+-----------+----------+--------------+  SFJ       Full                                                             +---------+---------------+---------+-----------+----------+--------------+  FV Prox   Full                                                             +---------+---------------+---------+-----------+----------+--------------+  FV Mid    Full                                                             +---------+---------------+---------+-----------+----------+--------------+  FV Distal Full                                                              +---------+---------------+---------+-----------+----------+--------------+  PFV       Full                                                             +---------+---------------+---------+-----------+----------+--------------+  POP       Full            Yes       Yes                                    +---------+---------------+---------+-----------+----------+--------------+  PTV       Full                                                             +---------+---------------+---------+-----------+----------+--------------+  PERO      Full                                                             +---------+---------------+---------+-----------+----------+--------------+  Summary: BILATERAL: - No evidence of deep vein thrombosis seen in the lower extremities, bilaterally. -No evidence of popliteal cyst, bilaterally.   *See table(s) above for measurements and observations. Electronically signed by Coral Else MD on 06/27/2021 at 7:44:40 PM.    Final    VAS [place] TRANSCRANIAL DOPPLER  Result Date: 06/25/2021  Transcranial Doppler Patient Name:  STORMY CONNON  Date of Exam:   06/25/2021 Medical Rec #: [id number]         Accession #:    [id number] Date of Birth: 1986/02/07          Patient Gender: F Patient Age:   43 years Exam Location:  [facility] Procedure:      VAS [place] TRANSCRANIAL DOPPLER Referring Phys: Lisbeth Renshaw --------------------------------------------------------------------------------  Indications: Subarachnoid hemorrhage. Comparison Study: prior 06/23/21 Performing Technologist: [place] Ponder RVS  Examination Guidelines: A complete evaluation includes B-mode imaging, spectral Doppler, color Doppler, and power Doppler as needed of all accessible portions of each vessel. Bilateral testing is considered an integral part of a complete examination. Limited examinations for reoccurring indications may be performed as noted.   +----------+-------------+----------+-----------+-------------+  RIGHT TCD  Right VM (cm) Depth (cm) Pulsatility    Comment     +----------+-------------+----------+-----------+-------------+  MCA           185.00                   0.68                    +----------+-------------+----------+-----------+-------------+  ACA           -25.00                   0.93                    +----------+-------------+----------+-----------+-------------+  Term ICA                                        not insonated  +----------+-------------+----------+-----------+-------------+  PCA            67.00                   0.80                    +----------+-------------+----------+-----------+-------------+  Opthalmic      34.00                   2.09                    +----------+-------------+----------+-----------+-------------+  ICA siphon     47.00                   1.08                    +----------+-------------+----------+-----------+-------------+  Vertebral     -84.00                   0.82                    +----------+-------------+----------+-----------+-------------+  Distal ICA     25.00                   1.11                    +----------+-------------+----------+-----------+-------------+  +----------+------------+----------+-----------+-------+  LEFT TCD   Left VM (cm) Depth (cm) Pulsatility Comment  +----------+------------+----------+-----------+-------+  MCA           58.00                   0.96              +----------+------------+----------+-----------+-------+  ACA           -38.00                  0.90              +----------+------------+----------+-----------+-------+  Term ICA      29.00                   0.87              +----------+------------+----------+-----------+-------+  PCA           43.00                   0.80              +----------+------------+----------+-----------+-------+  Opthalmic     24.00                   1.84               +----------+------------+----------+-----------+-------+  ICA siphon    48.00                   1.82              +----------+------------+----------+-----------+-------+  Vertebral     -77.00                  0.78              +----------+------------+----------+-----------+-------+  Distal ICA    41.00                   0.95              +----------+------------+----------+-----------+-------+  +------------+-------+-------+                VM cm  Comment  +------------+-------+-------+  Dist Basilar -152.00          +------------+-------+-------+ +----------------------+---+  Right Lindegaard Ratio 7.4  +----------------------+---+ +---------------------+---+  Left Lindegaard Ratio 1.4  +---------------------+---+  Summary:  Elevated mean flow velocities in right middle cerebral artery indicative of moderate vasospasm,basilar artery suggestive of severe vasospasm and right vertebral artery indicative of mild vasospasm. *See table(s) above for TCD measurements and observations.  Diagnosing physician: Delia Heady MD Electronically signed by Delia Heady MD on 06/25/2021 at 10:59:24 AM.    Final    VAS [place] TRANSCRANIAL DOPPLER  Result Date: 06/24/2021  Transcranial Doppler Patient Name:  JAMILET AMBROISE  Date of Exam:   06/23/2021 Medical Rec #: [id number]         Accession #:    [id number] Date of Birth: 1986-01-26          Patient Gender: F Patient Age:   52 years Exam Location:  [facility] Procedure:      VAS [place] TRANSCRANIAL DOPPLER Referring Phys: Lisbeth Renshaw --------------------------------------------------------------------------------  Indications: Subarachnoid hemorrhage. Limitations: Patient movement/sensitivity to orbital pressure. Neck collar. Comparison Study: 06-21-2021 TCD Performing Technologist: Jean Rosenthal RDMS, RVT  Examination Guidelines: A complete evaluation includes B-mode imaging, spectral Doppler, color Doppler, and power Doppler as needed of all accessible portions of each  vessel. Bilateral  testing is considered an integral part of a complete examination. Limited examinations for reoccurring indications may be performed as noted.  +----------+-------------+----------+-----------+------------------------------+  RIGHT TCD  Right VM (cm) Depth (cm) Pulsatility            Comment              +----------+-------------+----------+-----------+------------------------------+  MCA           110.00                   0.81                                     +----------+-------------+----------+-----------+------------------------------+  ACA           -33.00                   0.77                                     +----------+-------------+----------+-----------+------------------------------+  Term ICA       69.00                   1.15                                     +----------+-------------+----------+-----------+------------------------------+  PCA            56.00                   0.70                                     +----------+-------------+----------+-----------+------------------------------+  Opthalmic      22.00                   1.82                                     +----------+-------------+----------+-----------+------------------------------+  ICA siphon                                       Unable to insonate, patient                                                                movement             +----------+-------------+----------+-----------+------------------------------+  Vertebral     -15.00                   0.64                                     +----------+-------------+----------+-----------+------------------------------+  Distal ICA     19.00  0.68                                     +----------+-------------+----------+-----------+------------------------------+  +----------+-----------+---------+-----------+--------------------------------+  LEFT TCD     Left VM     Depth   Pulsatility             Comment                               (cm)       (cm)                                                  +----------+-----------+---------+-----------+--------------------------------+  MCA          102.00                 0.94                                       +----------+-----------+---------+-----------+--------------------------------+  ACA          -65.00                 0.52                                       +----------+-----------+---------+-----------+--------------------------------+  Term ICA      35.00                 1.11                                       +----------+-----------+---------+-----------+--------------------------------+  PCA           60.00                 0.96                                       +----------+-----------+---------+-----------+--------------------------------+  Opthalmic     23.00                 1.68                                       +----------+-----------+---------+-----------+--------------------------------+  ICA siphon                                     Unable to insonate, patient                                                               movement              +----------+-----------+---------+-----------+--------------------------------+  Vertebral    -27.00                 0.53                                       +----------+-----------+---------+-----------+--------------------------------+  Distal ICA    31.00                 0.83                                       +----------+-----------+---------+-----------+--------------------------------+  +------------+-----+-------------------------------+               VM cm             Comment              +------------+-----+-------------------------------+  Prox Basilar       Unable to insonate, neck collar  +------------+-----+-------------------------------+  Dist Basilar       Unable to insonate, neck collar  +------------+-----+-------------------------------+ Summary:  Elevated bilateral middle cerebral artery mean flow  velocities suggest mild vasospasm. Basilar artery not insonated due to poor suboccipital window. *See table(s) above for TCD measurements and observations.  Diagnosing physician: Delia Heady MD Electronically signed by Delia Heady MD on 06/24/2021 at 2:01:11 PM.    Final    VAS [place] TRANSCRANIAL DOPPLER  Result Date: 06/21/2021  Transcranial Doppler Patient Name:  MARKELLE ASARO  Date of Exam:   06/21/2021 Medical Rec #: [id number]         Accession #:    [id number] Date of Birth: [birth year]          Patient Gender: F Patient Age:   31 years Exam Location:  [facility] Procedure:      VAS [place] TRANSCRANIAL DOPPLER Referring Phys: Lisbeth Renshaw --------------------------------------------------------------------------------  Indications: Subarachnoid hemorrhage. Limitations: unable to insonate foramen window due to patient position. Comparison Study: no prior Performing Technologist: [place] Ponder RVS  Examination Guidelines: A complete evaluation includes B-mode imaging, spectral Doppler, color Doppler, and power Doppler as needed of all accessible portions of each vessel. Bilateral testing is considered an integral part of a complete examination. Limited examinations for reoccurring indications may be performed as noted.  +----------+-------------+----------+-----------+------------------+  RIGHT TCD  Right VM (cm) Depth (cm) Pulsatility      Comment        +----------+-------------+----------+-----------+------------------+  MCA            69.00                   1.01                         +----------+-------------+----------+-----------+------------------+  ACA           -63.00                   0.85                         +----------+-------------+----------+-----------+------------------+  Term ICA       46.00                   1.02                         +----------+-------------+----------+-----------+------------------+  PCA            48.00                   1.01                          +----------+-------------+----------+-----------+------------------+  Opthalmic      23.00                   1.59                         +----------+-------------+----------+-----------+------------------+  ICA siphon     43.00                   1.18                         +----------+-------------+----------+-----------+------------------+  Vertebral                                       unable to insonate  +----------+-------------+----------+-----------+------------------+  Distal ICA     33.00                   1.03                         +----------+-------------+----------+-----------+------------------+  +----------+------------+----------+-----------+------------------+  LEFT TCD   Left VM (cm) Depth (cm) Pulsatility      Comment        +----------+------------+----------+-----------+------------------+  MCA           50.00                   0.96                         +----------+------------+----------+-----------+------------------+  ACA           -36.00                  1.26                         +----------+------------+----------+-----------+------------------+  Term ICA      49.00                   0.90                         +----------+------------+----------+-----------+------------------+  PCA           26.00                   1.00                         +----------+------------+----------+-----------+------------------+  Opthalmic     24.00                   1.67                         +----------+------------+----------+-----------+------------------+  ICA siphon    40.00                   1.97                         +----------+------------+----------+-----------+------------------+  Vertebral                                      unable to insonate  +----------+------------+----------+-----------+------------------+  Distal ICA    23.00                   0.99                         +----------+------------+----------+-----------+------------------+   +------------+-----+------------------+               VM cm      Comment        +------------+-----+------------------+  Prox Basilar       unable to insonate  +------------+-----+------------------+  Dist Basilar       unable to insonate  +------------+-----+------------------+ +----------------------+---+  Right Lindegaard Ratio 1.6  +----------------------+---+  Summary:  Normal mean flow velocities without vasoapsam in all visualized vessels of anterior circulation. Unable to studyposterior circulation vessels due to absent suboccpital windows. *See table(s) above for TCD measurements and observations.  Diagnosing physician: Delia Heady MD Electronically signed by Delia Heady MD on 06/21/2021 at 1:13:55 PM.    Final    CT CHEST ABDOMEN PELVIS WO CONTRAST  Result Date: 07/05/2021 CLINICAL DATA:  Active donor, organ procurement EXAM: CT CHEST, ABDOMEN AND PELVIS WITHOUT CONTRAST TECHNIQUE: Multidetector CT imaging of the chest, abdomen and pelvis was performed following the standard protocol without IV contrast. COMPARISON:  CTA chest dated 06/24/2021 FINDINGS: CT CHEST FINDINGS Cardiovascular: The heart is normal in size. Trace pericardial effusion. No evidence of thoracic aortic aneurysm. Left IJ venous catheter terminates at cavoatrial junction. Mediastinum/Nodes: No suspicious mediastinal lymphadenopathy. Visualized thyroid is unremarkable. Lungs/Pleura: Preferential intubation of the right mainstem bronchus (series 3/image 21). Associated volume loss with atelectasis in the left lung. Superimposed patchy left lower lobe opacity, suspicious for pneumonia. Mild right lower lobe atelectasis. These are improved. No suspicious pulmonary nodules. No pleural effusion or pneumothorax. Musculoskeletal: No focal osseous lesions. CT ABDOMEN PELVIS FINDINGS Hepatobiliary: 10 mm cyst along the central aspect of segment 4A (series 3/image 45). Unenhanced liver is otherwise within normal limits. Liver measures 15.7 x  16.8 x 17.2 cm (AP by transverse by craniocaudal), estimated volume 2.37 L. Gallbladder is underdistended but unremarkable. Pancreas: Within normal limits. Spleen: Within normal limits. Adrenals/Urinary Tract: Adrenal glands are within normal limits. Two nonobstructing right lower pole renal calculi measuring up to 4 mm (series 3/image 69). Two nonobstructing left lower pole renal calculi measuring up to 5 mm (series 3/image 70). No hydronephrosis. Bladder is decompressed by an indwelling Foley catheter. Stomach/Bowel: Enteric tube terminates in the proximal gastric body. No evidence of bowel obstruction. Appendix is not discretely visualized. No colonic wall thickening or mass is seen. Rectal tube. Vascular/Lymphatic: No evidence of abdominal aortic aneurysm. No suspicious abdominopelvic lymphadenopathy. Reproductive: Uterus is within normal limits. Left ovary is within normal limits.  No right adnexal mass. Other: No abdominopelvic ascites. Musculoskeletal: Mild subcutaneous stranding/hemorrhage along the lower anterior abdominal wall (series 3/image 93). Visualized osseous structures are within normal limits. IMPRESSION: Preferential intubation of the right mainstem bronchus. Associated volume loss with atelectasis in the left lung. Left lower lobe pneumonia, improved. Mild right lower lobe opacity, favoring atelectasis, improved. 10 mm hepatic cyst in segment 4A. Bilateral nonobstructing renal calculi measuring up to 4 mm. Additional ancillary findings as above. Electronically Signed   By:  Charline Bills M.D.   On: 07/17/2021 04:04   CT ANGIO HEAD NECK W WO CM (CODE STROKE)  Result Date: 06/18/2021 CLINICAL DATA:  Subarachnoid hemorrhage follow up EXAM: CT ANGIOGRAPHY HEAD AND NECK TECHNIQUE: Multidetector CT imaging of the head and neck was performed using the standard protocol during bolus administration of intravenous contrast. Multiplanar CT image reconstructions and MIPs were obtained to evaluate the  vascular anatomy. Carotid stenosis measurements (when applicable) are obtained utilizing NASCET criteria, using the distal internal carotid diameter as the denominator. CONTRAST:  75mL ISOVUE-370 IOPAMIDOL (ISOVUE-370) INJECTION 76% COMPARISON:  None. FINDINGS: CTA NECK FINDINGS SKELETON: There is no bony spinal canal stenosis. No lytic or blastic lesion. OTHER NECK: Endotracheal intubation UPPER CHEST: No pneumothorax or pleural effusion. No nodules or masses. AORTIC ARCH: There is no calcific atherosclerosis of the aortic arch. There is no aneurysm, dissection or hemodynamically significant stenosis of the visualized portion of the aorta. Conventional 3 vessel aortic branching pattern. The visualized proximal subclavian arteries are widely patent. RIGHT CAROTID SYSTEM: Normal without aneurysm, dissection or stenosis. LEFT CAROTID SYSTEM: Normal without aneurysm, dissection or stenosis. VERTEBRAL ARTERIES: Left dominant configuration. Both origins are clearly patent. There is no dissection, occlusion or flow-limiting stenosis to the skull base (V1-V3 segments). CTA HEAD FINDINGS POSTERIOR CIRCULATION: --Vertebral arteries: Normal V4 segments. --Inferior cerebellar arteries: Normal. --Basilar artery: Normal. --Superior cerebellar arteries: Normal. --Posterior cerebral arteries (PCA): There is an inferiorly projecting aneurysm of the right P1 segment that measures 2 x 2 mm. The posterior cerebral arteries are otherwise normal. ANTERIOR CIRCULATION: --Intracranial internal carotid arteries: Normal. --Anterior cerebral arteries (ACA): Normal. Both A1 segments are present. Patent anterior communicating artery (a-comm). --Middle cerebral arteries (MCA): Normal. VENOUS SINUSES: As permitted by contrast timing, patent. ANATOMIC VARIANTS: None Review of the MIP images confirms the above findings. IMPRESSION: 1. 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 2. No  cervical carotid or vertebral artery stenosis. Electronically Signed   By: Deatra Robinson M.D.   On: 07/12/2021 03:09   IR ANGIO INTRA EXTRACRAN SEL INTERNAL CAROTID BILAT MOD SED  Result Date: 07/02/2021 PROCEDURE: DIAGNOSTIC CEREBRAL ANGIOGRAM HISTORY: The patient is a 36 year old woman presenting to the hospital with altered mental status requiring intubation. Workup included CT scan demonstrating diffuse basal subarachnoid hemorrhage and hydrocephalus. She underwent placement of a ventriculostomy in the intensive care unit. 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 Patient therefore presents today for diagnostic cerebral angiogram and possible endovascular treatment. ACCESS: The technical aspects of the procedure as well as its potential risks and benefits were reviewed with the patient. These risks included but were not limited bleeding, infection, allergic reaction, damage to organs or vital structures, stroke, non-diagnostic procedure, and the catastrophic outcomes of heart attack, coma, and death. With an understanding of these risks, informed consent was obtained and witnessed. The patient was placed in the supine position on the angiography table and the skin of right groin prepped in the usual sterile fashion. The procedure was performed under general anesthesia monitored by the anesthesia service. A 5- French sheath was introduced in the right common femoral artery using Seldinger technique. A fluoro-phase sequence was used to document the sheath position. MEDICATIONS: HEPARIN: 0 Units total. CONTRAST:  14mL OMNIPAQUE IOHEXOL 300 MG/ML SOLN, 50mL OMNIPAQUE IOHEXOL 300 MG/ML SOLN, 15mL OMNIPAQUE IOHEXOL 300 MG/ML SOLNcc, Omnipaque 300 FLUOROSCOPY TIME:  FLUOROSCOPY TIME: See IR records TECHNIQUE: CATHETERS  AND WIRES 5-French JB-1 catheter 0.035" glidewire VESSELS CATHETERIZED Right internal carotid Left internal carotid Left vertebral Right vertebral  Right common femoral VESSELS STUDIED Right internal carotid, head Left internal carotid, head Left vertebral Left vertebral, 3D rotation Right vertebral PROCEDURAL NARRATIVE A 5-Fr JB-1 catheter was advanced over a 0.035 glidewire into the aortic arch. The above vessels were then sequentially catheterized and cervical / cerebral angiograms taken. After review of images, the catheter was removed without incident. FINDINGS: Right internal carotid, head: Injection reveals the presence of a widely patent ICA, M1, and A1 segments and their branches. No aneurysms, AVMs, or high-flow fistulas are seen. Of note, there is a patent posterior communicating artery with flash filling of the right PCA. The parenchymal and venous phases are normal. The venous sinuses are widely patent. Left internal carotid, head: Injection reveals the presence of a widely patent ICA, A1, and M1 segments and their branches. No aneurysms, AVMs, or high-flow fistulas are seen. The parenchymal and venous phases are normal. The venous sinuses are widely patent. Left vertebral: Injection reveals the presence of a widely patent vertebral artery. This leads to a widely patent basilar artery that terminates in bilateral P1. The basilar apex is normal however there is an inferiorly projecting blister type aneurysm arising from the proximal right P1. This aneurysm is better delineated on the three-dimensional rotational angiogram. The parenchymal and venous phases are normal. The venous sinuses are widely patent. Right vertebral: The vertebral artery is widely patent. No PICA aneurysm is seen. See basilar description above. Left vertebral, 3D rotation: Three-dimensional rotational images were acquired and transferred to an independent work station for reconstruction and review. These reconstructed images further delineate the above-described right PCA aneurysm. Aneurysm is blister shaped, projecting inferiorly from the proximal right P1. The aneurysm  appears to measure approximately 3.3 mm wide and approximally 1.3 mm tall. DISPOSITION: Upon completion of the study, the femoral sheath was secured in place and connected to continuous heparinized saline flush. The procedure was well tolerated and no early complications were observed. The patient was transferred back to the intensive care unit for further care. IMPRESSION: 1. 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 This aneurysm is not amenable to primary coil embolization. The preliminary results of this procedure were shared with the patient and the patient's family. Electronically Signed   By: Lisbeth Renshaw   On: June 30, 2021 10:50   IR ANGIO VERTEBRAL SEL VERTEBRAL BILAT MOD SED  Result Date: 06-30-2021 PROCEDURE: DIAGNOSTIC CEREBRAL ANGIOGRAM HISTORY: The patient is a 36 year old woman presenting to the hospital with altered mental status requiring intubation. Workup included CT scan demonstrating diffuse basal subarachnoid hemorrhage and hydrocephalus. She underwent placement of a ventriculostomy in the intensive care unit. 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 Patient therefore presents today for diagnostic cerebral angiogram and possible endovascular treatment. ACCESS: The technical aspects of the procedure as well as its potential risks and benefits were reviewed with the patient. These risks included but were not limited bleeding, infection, allergic reaction, damage to organs or vital structures, stroke, non-diagnostic procedure, and the catastrophic outcomes of heart attack, coma, and death. With an understanding of these risks, informed consent was obtained and witnessed. The patient was placed in the supine position on the angiography table and the skin of right groin prepped in the usual sterile fashion. The procedure was performed under general anesthesia monitored by the  anesthesia service.  A 5- French sheath was introduced in the right common femoral artery using Seldinger technique. A fluoro-phase sequence was used to document the sheath position. MEDICATIONS: HEPARIN: 0 Units total. CONTRAST:  14mL OMNIPAQUE IOHEXOL 300 MG/ML SOLN, 50mL OMNIPAQUE IOHEXOL 300 MG/ML SOLN, 15mL OMNIPAQUE IOHEXOL 300 MG/ML SOLNcc, Omnipaque 300 FLUOROSCOPY TIME:  FLUOROSCOPY TIME: See IR records TECHNIQUE: CATHETERS AND WIRES 5-French JB-1 catheter 0.035" glidewire VESSELS CATHETERIZED Right internal carotid Left internal carotid Left vertebral Right vertebral Right common femoral VESSELS STUDIED Right internal carotid, head Left internal carotid, head Left vertebral Left vertebral, 3D rotation Right vertebral PROCEDURAL NARRATIVE A 5-Fr JB-1 catheter was advanced over a 0.035 glidewire into the aortic arch. The above vessels were then sequentially catheterized and cervical / cerebral angiograms taken. After review of images, the catheter was removed without incident. FINDINGS: Right internal carotid, head: Injection reveals the presence of a widely patent ICA, M1, and A1 segments and their branches. No aneurysms, AVMs, or high-flow fistulas are seen. Of note, there is a patent posterior communicating artery with flash filling of the right PCA. The parenchymal and venous phases are normal. The venous sinuses are widely patent. Left internal carotid, head: Injection reveals the presence of a widely patent ICA, A1, and M1 segments and their branches. No aneurysms, AVMs, or high-flow fistulas are seen. The parenchymal and venous phases are normal. The venous sinuses are widely patent. Left vertebral: Injection reveals the presence of a widely patent vertebral artery. This leads to a widely patent basilar artery that terminates in bilateral P1. The basilar apex is normal however there is an inferiorly projecting blister type aneurysm arising from the proximal right P1. This aneurysm is better delineated  on the three-dimensional rotational angiogram. The parenchymal and venous phases are normal. The venous sinuses are widely patent. Right vertebral: The vertebral artery is widely patent. No PICA aneurysm is seen. See basilar description above. Left vertebral, 3D rotation: Three-dimensional rotational images were acquired and transferred to an independent work station for reconstruction and review. These reconstructed images further delineate the above-described right PCA aneurysm. Aneurysm is blister shaped, projecting inferiorly from the proximal right P1. The aneurysm appears to measure approximately 3.3 mm wide and approximally 1.3 mm tall. DISPOSITION: Upon completion of the study, the femoral sheath was secured in place and connected to continuous heparinized saline flush. The procedure was well tolerated and no early complications were observed. The patient was transferred back to the intensive care unit for further care. IMPRESSION: 1. 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 This aneurysm is not amenable to primary coil embolization. The preliminary results of this procedure were shared with the patient and the patient's family. Electronically Signed   By: Lisbeth Renshaw   On: July 19, 2021 10:50    Microbiology Recent Results (from the past 240 hour(s))  Culture, Respiratory w Gram Stain     Status: None   Collection Time: 06/22/21  7:37 AM   Specimen: Tracheal Aspirate; Respiratory  Result Value Ref Range Status   Specimen Description TRACHEAL ASPIRATE  Final   Special Requests NONE  Final   Gram Stain   Final    MODERATE WBC PRESENT,BOTH PMN AND MONONUCLEAR MODERATE GRAM NEGATIVE RODS RARE GRAM POSITIVE RODS Performed at [facility], [address] [place], [place] [address]    Culture MODERATE STAPHYLOCOCCUS AUREUS  Final   Report Status 06/24/2021 FINAL  Final   Organism ID, Bacteria STAPHYLOCOCCUS AUREUS  Final  Susceptibility   Staphylococcus aureus - MIC*    CIPROFLOXACIN <=0.5 SENSITIVE Sensitive     ERYTHROMYCIN <=0.25 SENSITIVE Sensitive     GENTAMICIN <=0.5 SENSITIVE Sensitive     OXACILLIN <=0.25 SENSITIVE Sensitive     TETRACYCLINE <=1 SENSITIVE Sensitive     VANCOMYCIN 1 SENSITIVE Sensitive     TRIMETH/SULFA <=10 SENSITIVE Sensitive     CLINDAMYCIN <=0.25 SENSITIVE Sensitive     RIFAMPIN <=0.5 SENSITIVE Sensitive     Inducible Clindamycin NEGATIVE Sensitive     * MODERATE STAPHYLOCOCCUS AUREUS  Culture, blood (routine x 2)     Status: None (Preliminary result)   Collection Time: 06/27/21 11:37 PM   Specimen: BLOOD RIGHT HAND  Result Value Ref Range Status   Specimen Description BLOOD RIGHT HAND  Final   Special Requests   Final    BOTTLES DRAWN AEROBIC ONLY Blood Culture adequate volume   Culture   Final    NO GROWTH 2 DAYS Performed at [facility], [address], [place], [place] [address]    Report Status PENDING  Incomplete  Culture, blood (routine x 2)     Status: Abnormal   Collection Time: 06/27/21 11:50 PM   Specimen: BLOOD LEFT HAND  Result Value Ref Range Status   Specimen Description BLOOD LEFT HAND  Final   Special Requests   Final    BOTTLES DRAWN AEROBIC ONLY Blood Culture adequate volume   Culture  Setup Time   Final    GRAM POSITIVE COCCI IN CLUSTERS AEROBIC BOTTLE ONLY CRITICAL RESULT CALLED TO, READ BACK BY AND VERIFIED WITH: [email or web address],[id number]  07/03/2021 MK    Culture (A)  Final    STAPHYLOCOCCUS EPIDERMIDIS THE SIGNIFICANCE OF ISOLATING THIS ORGANISM FROM A SINGLE SET OF BLOOD CULTURES WHEN MULTIPLE SETS ARE DRAWN IS UNCERTAIN. PLEASE NOTIFY THE MICROBIOLOGY DEPARTMENT WITHIN ONE WEEK IF SPECIATION AND SENSITIVITIES ARE REQUIRED. Performed at [facility], [address], [place], [place] [address]    Report Status 06/30/2021 FINAL  Final  Blood Culture ID Panel (Reflexed)     Status: Abnormal   Collection Time: 06/27/21 11:50 PM  Result  Value Ref Range Status   Enterococcus faecalis NOT DETECTED NOT DETECTED Final   Enterococcus Faecium NOT DETECTED NOT DETECTED Final   Listeria monocytogenes NOT DETECTED NOT DETECTED Final   Staphylococcus species DETECTED (A) NOT DETECTED Final    Comment: CRITICAL RESULT CALLED TO, READ BACK BY AND VERIFIED WITH: MEYER,[id number]  06/23/2021 MK    Staphylococcus aureus (BCID) NOT DETECTED NOT DETECTED Final   Staphylococcus epidermidis DETECTED (A) NOT DETECTED Final    Comment: Methicillin (oxacillin) resistant coagulase negative staphylococcus. Possible blood culture contaminant (unless isolated from more than one blood culture draw or clinical case suggests pathogenicity). No antibiotic treatment is indicated for blood  culture contaminants. CRITICAL RESULT CALLED TO, READ BACK BY AND VERIFIED WITH: MEYER,[id number]  07/08/2021 MK    Staphylococcus lugdunensis NOT DETECTED NOT DETECTED Final   Streptococcus species NOT DETECTED NOT DETECTED Final   Streptococcus agalactiae NOT DETECTED NOT DETECTED Final   Streptococcus pneumoniae NOT DETECTED NOT DETECTED Final   Streptococcus pyogenes NOT DETECTED NOT DETECTED Final   A.calcoaceticus-baumannii NOT DETECTED NOT DETECTED Final   Bacteroides fragilis NOT DETECTED NOT DETECTED Final   Enterobacterales NOT DETECTED NOT DETECTED Final   Enterobacter cloacae complex NOT DETECTED NOT DETECTED Final   Escherichia coli NOT DETECTED NOT DETECTED Final   Klebsiella aerogenes NOT DETECTED NOT DETECTED Final   Klebsiella oxytoca  NOT DETECTED NOT DETECTED Final   Klebsiella pneumoniae NOT DETECTED NOT DETECTED Final   Proteus species NOT DETECTED NOT DETECTED Final   Salmonella species NOT DETECTED NOT DETECTED Final   Serratia marcescens NOT DETECTED NOT DETECTED Final   Haemophilus influenzae NOT DETECTED NOT DETECTED Final   Neisseria meningitidis NOT DETECTED NOT DETECTED Final   Pseudomonas aeruginosa NOT DETECTED NOT DETECTED Final    Stenotrophomonas maltophilia NOT DETECTED NOT DETECTED Final   Candida albicans NOT DETECTED NOT DETECTED Final   Candida auris NOT DETECTED NOT DETECTED Final   Candida glabrata NOT DETECTED NOT DETECTED Final   Candida krusei NOT DETECTED NOT DETECTED Final   Candida parapsilosis NOT DETECTED NOT DETECTED Final   Candida tropicalis NOT DETECTED NOT DETECTED Final   Cryptococcus neoformans/gattii NOT DETECTED NOT DETECTED Final   Methicillin resistance mecA/C DETECTED (A) NOT DETECTED Final    Comment: CRITICAL RESULT CALLED TO, READ BACK BY AND VERIFIED WITH: MEYER,[id number]  06/27/2021 MK Performed at [facility], [address] [place], [place] [address]   Urine Culture     Status: None   Collection Time: 2021-07-28 12:50 AM   Specimen: Urine, Catheterized  Result Value Ref Range Status   Specimen Description URINE, CATHETERIZED  Final   Special Requests NONE  Final   Culture   Final    NO GROWTH Performed at [facility], [address], [place], [place] [address]    Report Status 2021-07-28 FINAL  Final  Culture, Respiratory w Gram Stain     Status: None   Collection Time: 07/28/2021 12:59 AM   Specimen: Tracheal Aspirate; Respiratory  Result Value Ref Range Status   Specimen Description TRACHEAL ASPIRATE  Final   Special Requests NONE  Final   Gram Stain   Final    MODERATE WBC PRESENT,BOTH PMN AND MONONUCLEAR GRAM POSITIVE COCCI Performed at [facility], [address] [place], [place] [address]    Culture MODERATE STAPHYLOCOCCUS AUREUS  Final   Report Status 06/30/2021 FINAL  Final   Organism ID, Bacteria STAPHYLOCOCCUS AUREUS  Final      Susceptibility   Staphylococcus aureus - MIC*    CIPROFLOXACIN <=0.5 SENSITIVE Sensitive     ERYTHROMYCIN <=0.25 SENSITIVE Sensitive     GENTAMICIN <=0.5 SENSITIVE Sensitive     OXACILLIN <=0.25 SENSITIVE Sensitive     TETRACYCLINE <=1 SENSITIVE Sensitive     VANCOMYCIN 1 SENSITIVE Sensitive     TRIMETH/SULFA  <=10 SENSITIVE Sensitive     CLINDAMYCIN <=0.25 SENSITIVE Sensitive     RIFAMPIN <=0.5 SENSITIVE Sensitive     Inducible Clindamycin NEGATIVE Sensitive     * MODERATE STAPHYLOCOCCUS AUREUS  Resp Panel by RT-PCR (Flu A&B, Covid) Nasopharyngeal Swab     Status: None   Collection Time: 2021-07-28 12:59 AM   Specimen: Nasopharyngeal Swab; Nasopharyngeal(NP) swabs in vial transport medium  Result Value Ref Range Status   SARS Coronavirus 2 by RT PCR NEGATIVE NEGATIVE Final    Comment: (NOTE) SARS-CoV-2 target nucleic acids are NOT DETECTED.  The SARS-CoV-2 RNA is generally detectable in upper respiratory specimens during the acute phase of infection. The lowest concentration of SARS-CoV-2 viral copies this assay can detect is 138 copies/mL. A negative result does not preclude SARS-Cov-2 infection and should not be used as the sole basis for treatment or other patient management decisions. A negative result may occur with  improper specimen collection/handling, submission of specimen other than nasopharyngeal swab, presence of viral mutation(s)  within the areas targeted by this assay, and inadequate number of viral copies(<138 copies/mL). A negative result must be combined with clinical observations, patient history, and epidemiological information. The expected result is Negative.  Fact Sheet for Patients:  BloggerCourse.com  Fact Sheet for Healthcare Providers:  SeriousBroker.it  This test is no t yet approved or cleared by the [place] FDA and  has been authorized for detection and/or diagnosis of SARS-CoV-2 by FDA under an Emergency Use Authorization (EUA). This EUA will remain  in effect (meaning this test can be used) for the duration of the COVID-19 declaration under Section 564(b)(1) of the Act, 21 U.S.C.section 360bbb-3(b)(1), unless the authorization is terminated  or revoked sooner.       Influenza A by PCR NEGATIVE  NEGATIVE Final   Influenza B by PCR NEGATIVE NEGATIVE Final    Comment: (NOTE) The Xpert Xpress SARS-CoV-2/FLU/RSV plus assay is intended as an aid in the diagnosis of influenza from Nasopharyngeal swab specimens and should not be used as a sole basis for treatment. Nasal washings and aspirates are unacceptable for Xpert Xpress SARS-CoV-2/FLU/RSV testing.  Fact Sheet for Patients: BloggerCourse.com  Fact Sheet for Healthcare Providers: SeriousBroker.it  This test is not yet approved or cleared by the [place] FDA and has been authorized for detection and/or diagnosis of SARS-CoV-2 by FDA under an Emergency Use Authorization (EUA). This EUA will remain in effect (meaning this test can be used) for the duration of the COVID-19 declaration under Section 564(b)(1) of the Act, 21 U.S.C. section 360bbb-3(b)(1), unless the authorization is terminated or revoked.  Performed at [facility], [address], [place], [place] [address]     Lab Basic Metabolic Panel: Recent Labs  Lab 06/24/21 [phone] 06/25/21 0304 06/27/21 0424 07-07-2021 0022 Jul 07, 2021 0500 07/07/21 1149 Jul 07, 2021 1315 07-07-21 1402 07/07/2021 2211 07-07-21 2353 06/20/2021 0401 07/06/2021 0501 06/30/2021 1209 06/18/2021 1217  NA 146*   < > 149*   < > 141   < > 142   < > 143 144 146* 146* 154* 153*  K 4.2   < > 3.1*   < > 3.7   < > 4.2   < > 3.5 3.6 3.7 3.7 3.6 3.5  CL 117*   < > 107   < > 100  --  98  --  104  --   --  107 115*  --   CO2 22   < > 36*   < > 35*  --  34*  --  33*  --   --  32 31  --   GLUCOSE 261*   < > 112*   < > 296*  --  341*  --  205*  --   --  197* 170*  --   BUN 33*   < > 24*   < > 19  --  21*  --  22*  --   --  17 14  --   CREATININE 0.80   < > 0.73   < > 0.65  --  0.71  --  0.72  --   --  0.84 0.80  --   CALCIUM 8.4*   < > 8.5*   < > 8.5*  --  8.6*  --  9.2  --   --  9.2 9.3  --   MG  --   --  2.3  --  1.9  --   --   --  2.1  --   --   --    --   --  PHOS 2.6  --  2.1*  --   --   --   --   --  1.8*  --   --   --   --   --    < > = values in this interval not displayed.   Liver Function Tests: Recent Labs  Lab 06/30/2021 0032 07/08/2021 1315 07/03/2021 2211 07/12/2021 0501 06/22/2021 1209  AST 32 22 18 22  32  ALT 31 23 19 20 21   ALKPHOS 181* 152* 135* 132* 137*  BILITOT 0.5 0.4 0.3 0.3 0.6  PROT 6.1* 5.8* 5.6* 5.4* 5.3*  ALBUMIN 1.8* 1.7* 1.9* 2.1* 2.0*   Recent Labs  Lab 07/03/2021 1506  LIPASE 44  AMYLASE 39   No results for input(s): AMMONIA in the last 168 hours. CBC: Recent Labs  Lab 06/26/21 0436 06/27/21 0424 07/10/2021 0022 07/09/2021 0500 06/21/2021 1149 06/20/2021 1315 07/15/2021 1402 06/21/2021 2211 07/04/2021 2353 07/07/2021 0401 07/09/2021 0501 07/17/2021 1209 07/10/2021 1217  WBC 14.5* 22.2*   < > 27.3*  --  20.5*  --  25.2*  --   --  23.5* 25.0*  --   NEUTROABS 11.8* 17.4*  --  24.5*  --   --   --  24.7*  --   --  22.5*  --   --   HGB 10.7* 10.8*   < > 8.7*   < > 7.5*   < > 7.1* 6.5* 6.5* 6.4* 8.1* 8.2*  HCT 32.7* 34.8*   < > 28.2*   < > 25.0*   < > 22.2* 19.0* 19.0* 21.0* 25.1* 24.0*  MCV 84.7 90.4   < > 91.0  --  90.3  --  89.2  --   --  90.1 87.8  --   PLT 361 351   < > 240  --  213  --  221  --   --  258 275  --    < > = values in this interval not displayed.   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

## 2021-07-18 NOTE — Progress Notes (Signed)
°   2021/07/24 1345  Clinical Encounter Type  Visited With Patient and family together  Visit Type Follow-up;Spiritual support  Referral From Chaplain (Chaplain Sallyanne Kuster)  Consult/Referral To [facility] visited. Kelly Gillespie met the patient's mother, Kelly Gillespie, in the waiting area along with several family members. The patient's cousin, Kelly Gillespie, and boyfriend, Kelly Gillespie, were tearfully at the bedside. Kelly Gillespie offered spiritual support by reading the sacred text and prayer and requested a hospitality tray for the family. The mother acknowledged their strong support network. She also expressed appreciation for Chaplain's and medical teams' care and support. This note was prepared by Jeanine Luz, M.Div..  For questions please contact by phone [phone].

## 2021-07-18 NOTE — Progress Notes (Signed)
Nutrition Brief Note  Chart reviewed. Pt now transitioning to comfort care.  No further nutrition interventions planned at this time.  Please re-consult as needed.   Maxene Byington P., RD, LDN, CNSC See AMiON for contact information    

## 2021-07-18 NOTE — Progress Notes (Signed)
Next of kin contact information: Mother: Asencion Partridge [address] [place] [address] [phone]  Father: Jamirra Curnow [address] [place] [address] [phone]

## 2021-07-18 NOTE — Anesthesia Preprocedure Evaluation (Signed)
Anesthesia Evaluation  Patient identified by MRN, date of birth, ID band Patient awake  General Assessment Comment:S/p SAH now declared brain dead, intubated. For organ donation.  Reviewed: Allergy & Precautions, NPO status , Patient's Chart, lab work & pertinent test results  Airway Mallampati: Intubated       Dental   Pulmonary       + intubated    Cardiovascular negative cardio ROS   Rhythm:Regular Rate:Normal     Neuro/Psych Seizures -,  PSYCHIATRIC DISORDERS Depression Subarachnoid hemmorrhage    GI/Hepatic negative GI ROS, Neg liver ROS,   Endo/Other  diabetes, Type 2  Renal/GU negative Renal ROS     Musculoskeletal negative musculoskeletal ROS (+)   Abdominal   Peds  Hematology  (+) Blood dyscrasia, anemia ,   Anesthesia Other Findings Day of surgery medications reviewed with the patient.  Reproductive/Obstetrics                             Anesthesia Physical Anesthesia Plan  ASA: 6  Anesthesia Plan: General   Post-op Pain Management:    Induction: Inhalational  PONV Risk Score and Plan: 3 and Treatment may vary due to age or medical condition  Airway Management Planned: Oral ETT  Additional Equipment: Arterial line  Intra-op Plan:   Post-operative Plan:   Informed Consent: I have reviewed the patients History and Physical, chart, labs and discussed the procedure including the risks, benefits and alternatives for the proposed anesthesia with the patient or authorized representative who has indicated his/her understanding and acceptance.     History available from chart only  Plan Discussed with:   Anesthesia Plan Comments: (Deceased organ donation. Terminal extubation once surgery complete.)        Anesthesia Quick Evaluation

## 2021-07-18 NOTE — Anesthesia Postprocedure Evaluation (Signed)
Anesthesia Post Note  Patient: Kelly Gillespie  Procedure(s) Performed: ORGAN PROCUREMENT (Abdomen)     Patient location during evaluation: Other (OR) Anesthesia Type: General Post-procedure mental status: Deceased per operative plan for organ donation. Pain control: N/A. Respiratory status: N/A. Cardiovascular status: N/A. : N/A. Anesthetic complications: no Comments: Organ donation.  Anesthesia team turned over care to organ procurement team following aortic cross clamp per surgeon request.   No notable events documented.  Last Vitals:  Vitals:   07/12/2021 1445 06/21/2021 1500  BP:  (!) 96/55  Pulse: (!) 118   Resp: 18 18  Temp:    SpO2: 100%     Last Pain:  Vitals:   07/12/2021 1115  TempSrc: Axillary  PainSc:                  Cecile Hearing

## 2021-07-18 NOTE — Progress Notes (Addendum)
PHARMACY - PHYSICIAN COMMUNICATION CRITICAL VALUE ALERT - BLOOD CULTURE IDENTIFICATION (BCID)  Kelly Gillespie is an 36 y.o. female   with SAH  Assessment:  Blood cultures with GPC/clusters, BCID shows staph epidermidis and MEC resistance detected  Name of physician (or Provider) Contacted:  Dr. 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 earlier today  Results for orders placed or performed during the hospital encounter of 06/18/2021  Blood Culture ID Panel (Reflexed) (Collected: 06/27/2021 11:50 PM)  Result Value Ref Range   Enterococcus faecalis NOT DETECTED NOT DETECTED   Enterococcus Faecium NOT DETECTED NOT DETECTED   Listeria monocytogenes NOT DETECTED NOT DETECTED   Staphylococcus species DETECTED (A) NOT DETECTED   Staphylococcus aureus (BCID) NOT DETECTED NOT DETECTED   Staphylococcus epidermidis DETECTED (A) NOT DETECTED   Staphylococcus lugdunensis NOT DETECTED NOT DETECTED   Streptococcus species NOT DETECTED NOT DETECTED   Streptococcus agalactiae NOT DETECTED NOT DETECTED   Streptococcus pneumoniae NOT DETECTED NOT DETECTED   Streptococcus pyogenes NOT DETECTED NOT DETECTED   A.calcoaceticus-baumannii NOT DETECTED NOT DETECTED   Bacteroides fragilis NOT DETECTED NOT DETECTED   Enterobacterales NOT DETECTED NOT DETECTED   Enterobacter cloacae complex NOT DETECTED NOT DETECTED   Escherichia coli NOT DETECTED NOT DETECTED   Klebsiella aerogenes NOT DETECTED NOT DETECTED   Klebsiella oxytoca NOT DETECTED NOT DETECTED   Klebsiella pneumoniae NOT DETECTED NOT DETECTED   Proteus species NOT DETECTED NOT DETECTED   Salmonella species NOT DETECTED NOT DETECTED   Serratia marcescens NOT DETECTED NOT DETECTED   Haemophilus influenzae NOT DETECTED NOT DETECTED   Neisseria meningitidis NOT DETECTED NOT DETECTED   Pseudomonas aeruginosa NOT DETECTED NOT DETECTED    Stenotrophomonas maltophilia NOT DETECTED NOT DETECTED   Candida albicans NOT DETECTED NOT DETECTED   Candida auris NOT DETECTED NOT DETECTED   Candida glabrata NOT DETECTED NOT DETECTED   Candida krusei NOT DETECTED NOT DETECTED   Candida parapsilosis NOT DETECTED NOT DETECTED   Candida tropicalis NOT DETECTED NOT DETECTED   Cryptococcus neoformans/gattii NOT DETECTED NOT DETECTED   Methicillin resistance mecA/C DETECTED (A) NOT DETECTED   Harland German, PharmD Clinical Pharmacist **Pharmacist phone directory can now be found on [email or web address] ([email or web address]).  Listed under [facility].

## 2021-07-18 NOTE — Progress Notes (Signed)
eLink Physician-Brief Progress Note Patient Name: YAMAIRA SPINNER DOB: 05/10/1986 MRN: [id number]   Date of Service  07/17/2021  HPI/Events of Note  Hemoglobin 6.4 gm / dl. [facility] requesting that patient be transfused ahead of organ procurement later today.  eICU Interventions  Order entered to transfuse one unit PRBC.        Thomasene Lot Leonore Frankson 07/10/2021, 6:29 AM

## 2021-07-18 NOTE — Transfer of Care (Signed)
Immediate Anesthesia Transfer of Care Note  Patient: Kelly Gillespie  Procedure(s) Performed: ORGAN PROCUREMENT (Abdomen)  Patient Location: Transferred care to organ donation team post cross clamped. Report of interventions given.   Anesthesia Type:  Level of Consciousness:   Airway & Oxygen Therapy:   Post-op Assessment:   Post vital signs:   Last Vitals:  Vitals Value Taken Time  BP    Temp    Pulse    Resp    SpO2      Last Pain:  Vitals:   07/04/2021 1115  TempSrc: Axillary  PainSc:          Complications: No notable events documented.

## 2021-07-18 DEATH — deceased

## 2023-11-29 IMAGING — DX DG CHEST 1V PORT
1 series · 1 of 1 positions shown · non-contrast
Comparison: June 26, 2021.

CLINICAL DATA: Respiratory weaning.

EXAM:
PORTABLE CHEST 1 VIEW

[chest]
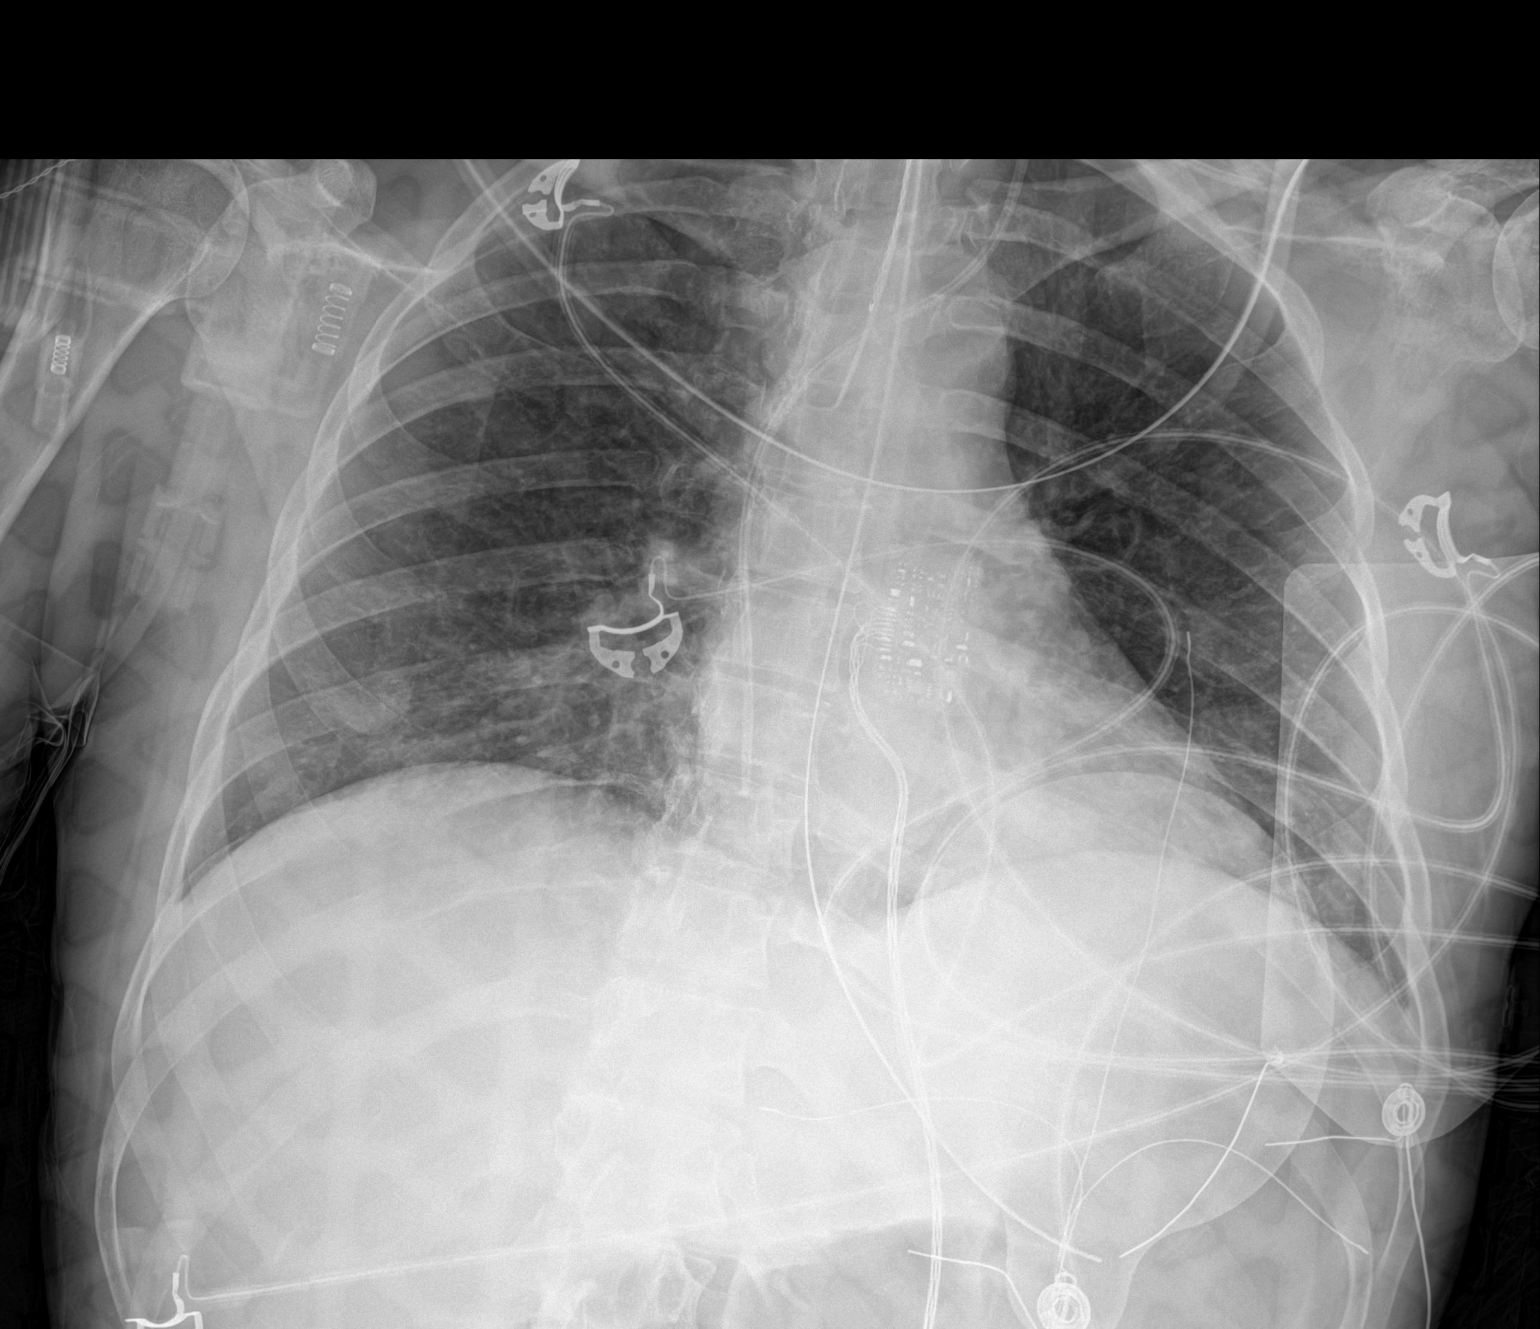

[1 of 1 positions shown; findings below may reference images not displayed]

FINDINGS: The heart size and mediastinal contours are within normal limits.
Endotracheal and nasogastric tubes are unchanged in position. Left
internal jugular catheter is unchanged in position. Both lungs are
clear. The visualized skeletal structures are unremarkable.
IMPRESSION: Stable support apparatus.  No acute abnormality is seen.

## 2023-11-30 IMAGING — CT CT CHEST-ABD-PELV W/O CM
2 of 4 series · 14 of 46 positions shown, 16 images · non-contrast
Comparison: CTA chest dated 06/24/2021

CLINICAL DATA: Active donor, organ procurement

EXAM:
CT CHEST, ABDOMEN AND PELVIS WITHOUT CONTRAST
TECHNIQUE: Multidetector CT imaging of the chest, abdomen and pelvis was
performed following the standard protocol without IV contrast.

[Series 3: cap without · axial · non-contrast · 0.82mm/px · z∈[+17,+552]mm · 11 of 125 slices shown, 13 images]
[im 9/125  soft-tissue]
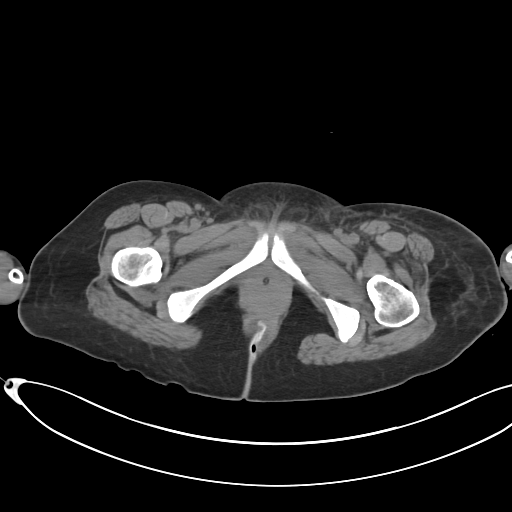
[im 9/125  bone]
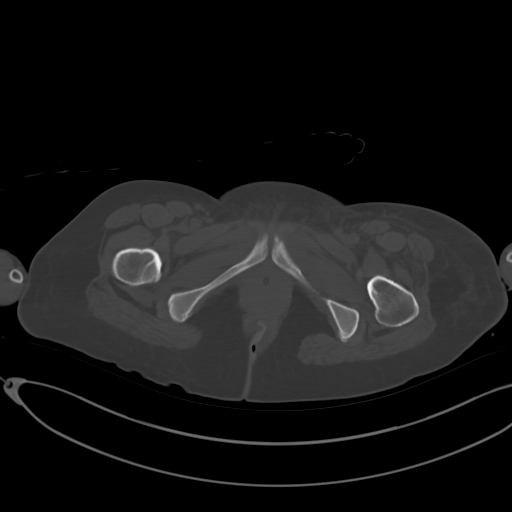
[im 18/125  soft-tissue]
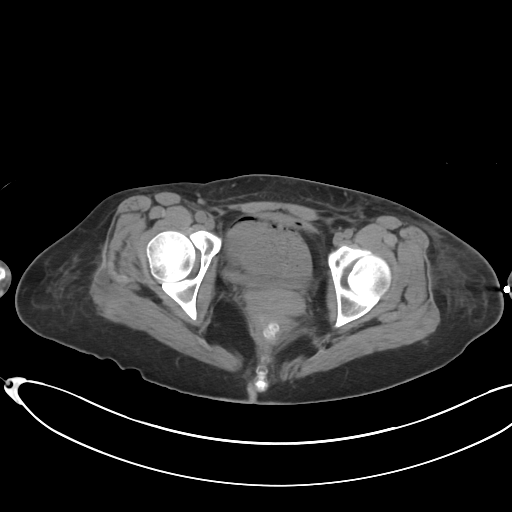
[im 27/125  soft-tissue]
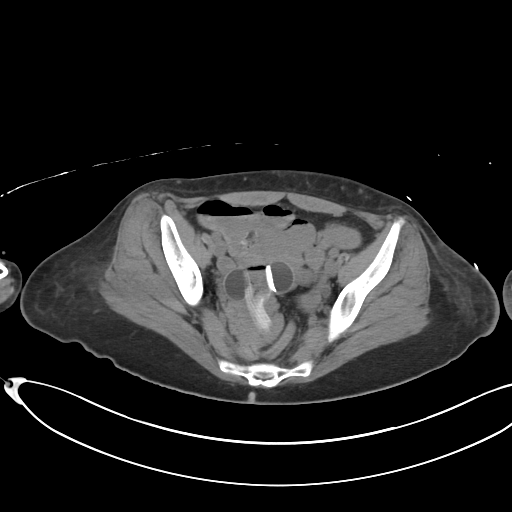
[im 45/125  soft-tissue]
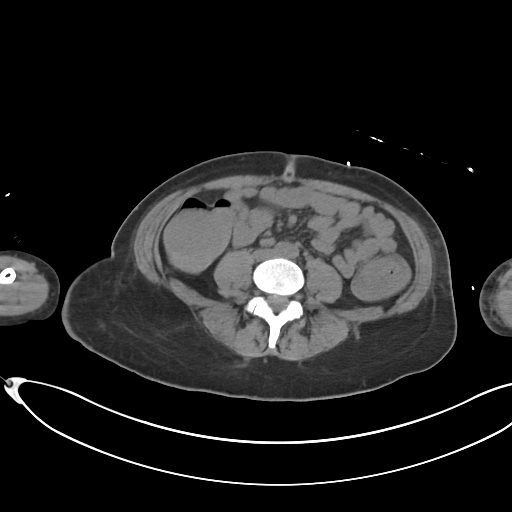
[im 54/125  soft-tissue]
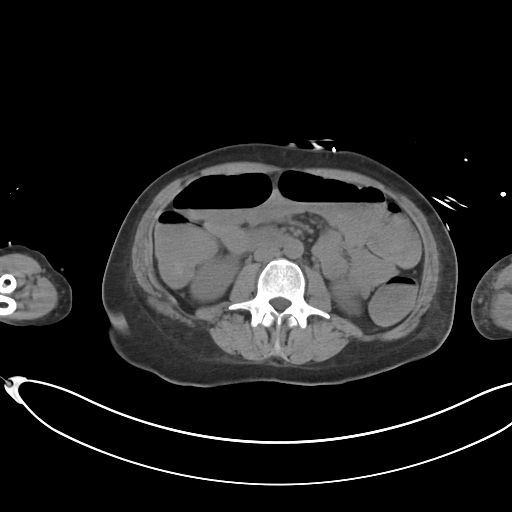
[im 63/125  soft-tissue]
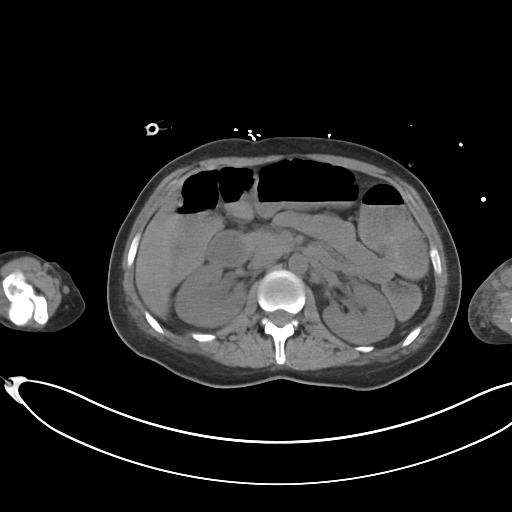
[im 71/125  soft-tissue]
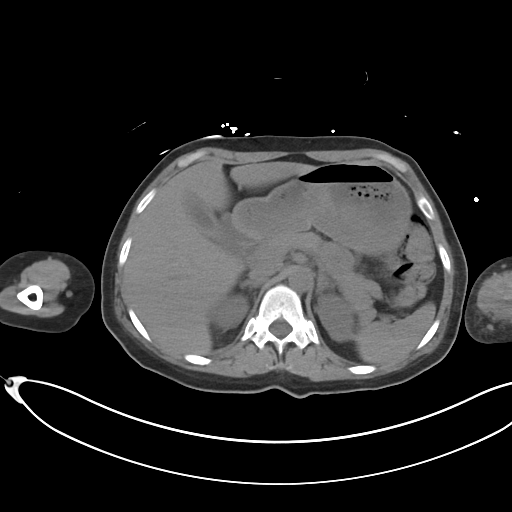
[im 80/125  soft-tissue]
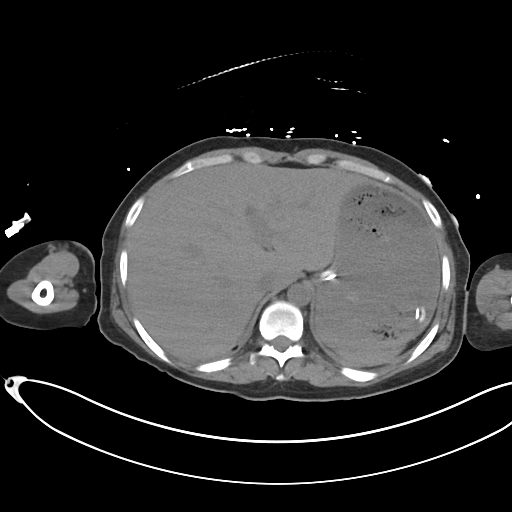
[im 98/125  soft-tissue]
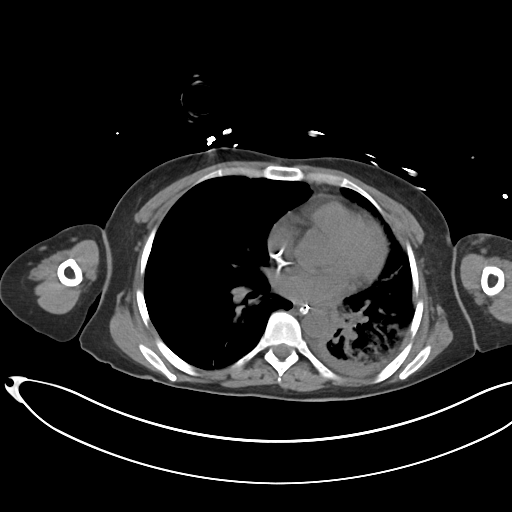
[im 98/125  bone]
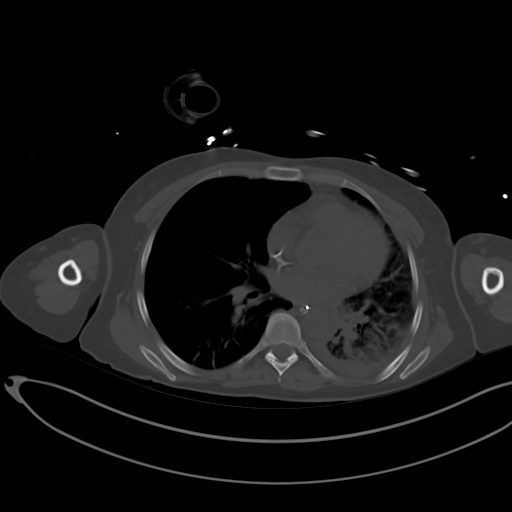
[im 107/125  soft-tissue]
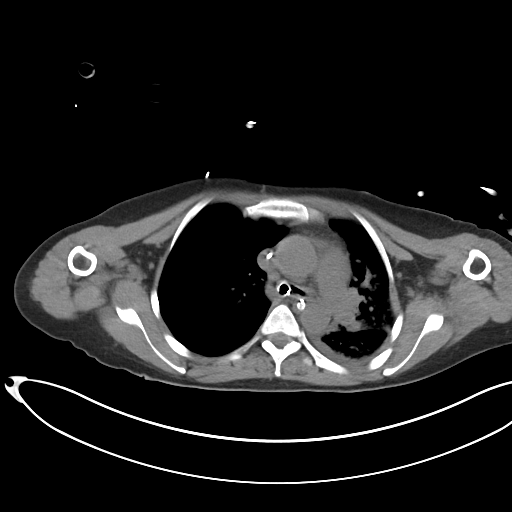
[im 116/125  soft-tissue]
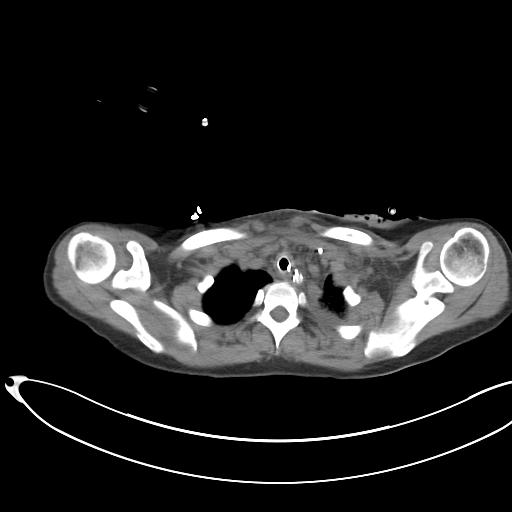

[Series 6: cor · coronal · 0.76mm/px · 3 of 83 slices shown]
[im 28/83  soft-tissue]
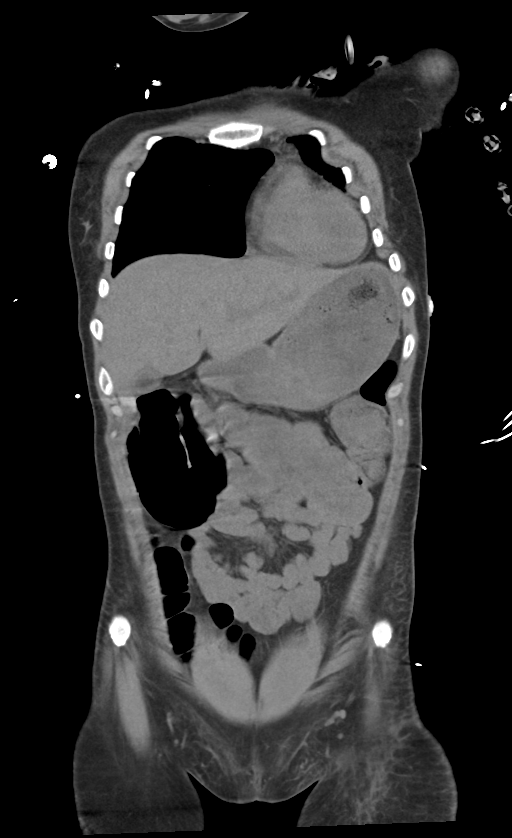
[im 37/83  soft-tissue]
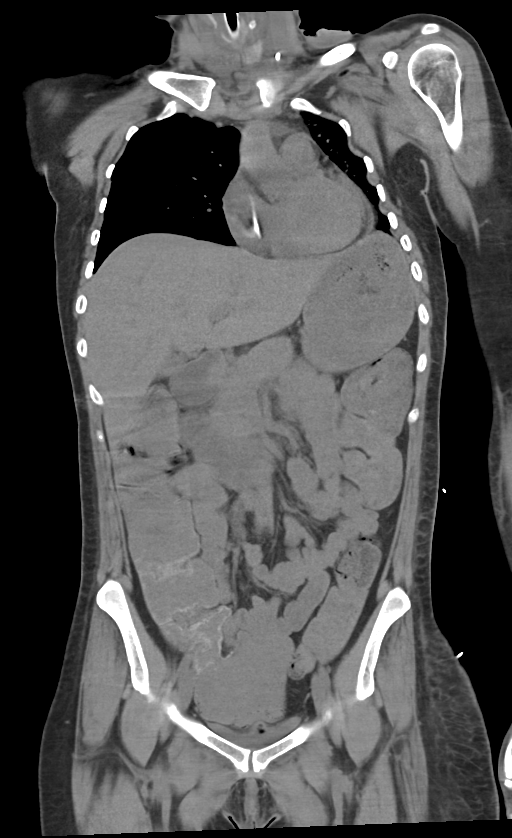
[im 46/83  soft-tissue]
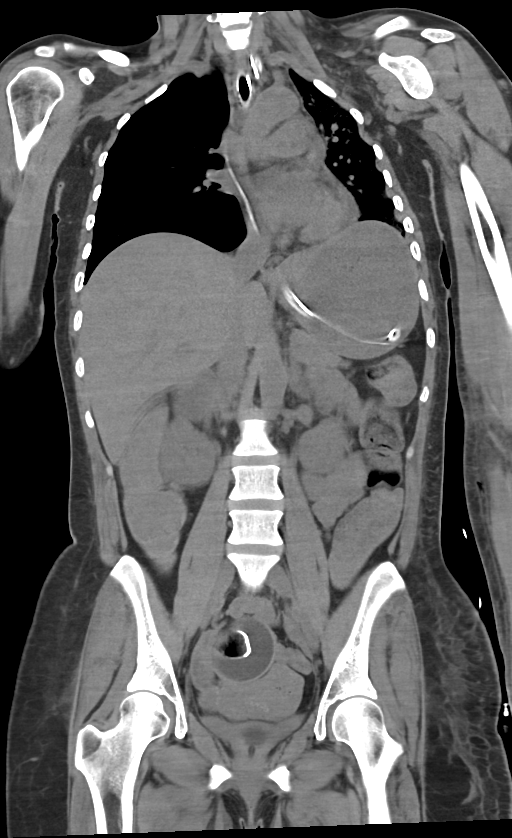

[14 of 46 positions shown; findings below may reference images not displayed]

FINDINGS: CT CHEST FINDINGS

Cardiovascular: The heart is normal in size. Trace pericardial
effusion.

No evidence of thoracic aortic aneurysm.

Left IJ venous catheter terminates at cavoatrial junction.

Mediastinum/Nodes: No suspicious mediastinal lymphadenopathy.

Visualized thyroid is unremarkable.

Lungs/Pleura: Preferential intubation of the right mainstem bronchus
(series 3/image 21).

Associated volume loss with atelectasis in the left lung.

Superimposed patchy left lower lobe opacity, suspicious for
pneumonia. Mild right lower lobe atelectasis. These are improved.

No suspicious pulmonary nodules.

No pleural effusion or pneumothorax.

Musculoskeletal: No focal osseous lesions.

CT ABDOMEN PELVIS FINDINGS

Hepatobiliary: 10 mm cyst along the central aspect of segment 4A
(series 3/image 45). Unenhanced liver is otherwise within normal
limits. Liver measures 15.7 x 16.8 x 17.2 cm (AP by transverse by
craniocaudal), estimated volume 2.37 L.

Gallbladder is underdistended but unremarkable.

Pancreas: Within normal limits.

Spleen: Within normal limits.

Adrenals/Urinary Tract: Adrenal glands are within normal limits.

Two nonobstructing right lower pole renal calculi measuring up to 4
mm (series 3/image 69). Two nonobstructing left lower pole renal
calculi measuring up to 5 mm (series 3/image 70). No hydronephrosis.

Bladder is decompressed by an indwelling Foley catheter.

Stomach/Bowel: Enteric tube terminates in the proximal gastric body.

No evidence of bowel obstruction.

Appendix is not discretely visualized.

No colonic wall thickening or mass is seen.

Rectal tube.

Vascular/Lymphatic: No evidence of abdominal aortic aneurysm.

No suspicious abdominopelvic lymphadenopathy.

Reproductive: Uterus is within normal limits.

Left ovary is within normal limits.  No right adnexal mass.

Other: No abdominopelvic ascites.

Musculoskeletal: Mild subcutaneous stranding/hemorrhage along the
lower anterior abdominal wall (series 3/image 93).

Visualized osseous structures are within normal limits.
IMPRESSION: Preferential intubation of the right mainstem bronchus. Associated
volume loss with atelectasis in the left lung.

Left lower lobe pneumonia, improved. Mild right lower lobe opacity,
favoring atelectasis, improved.

10 mm hepatic cyst in segment 4A. Bilateral nonobstructing renal
calculi measuring up to 4 mm.

Additional ancillary findings as above.
